# Patient Record
Sex: Female | Born: 1950
Health system: Southern US, Community
[De-identification: ages and names within clinical notes are randomized; demographics above are authoritative.]

## PROBLEM LIST (undated history)

## (undated) DIAGNOSIS — E079 Disorder of thyroid, unspecified: Secondary | ICD-10-CM

## (undated) DIAGNOSIS — I10 Essential (primary) hypertension: Secondary | ICD-10-CM

## (undated) DIAGNOSIS — T7840XA Allergy, unspecified, initial encounter: Secondary | ICD-10-CM

## (undated) HISTORY — DX: Essential (primary) hypertension: I10

## (undated) HISTORY — DX: Allergy, unspecified, initial encounter: T78.40XA

## (undated) HISTORY — PX: ABDOMINAL HYSTERECTOMY: SHX81

## (undated) HISTORY — DX: Disorder of thyroid, unspecified: E07.9

---

## 2000-05-30 ENCOUNTER — Ambulatory Visit (HOSPITAL_COMMUNITY): Admission: RE | Admit: 2000-05-30 | Discharge: 2000-05-30 | Payer: Self-pay | Admitting: Family Medicine

## 2001-07-07 ENCOUNTER — Other Ambulatory Visit: Admission: RE | Admit: 2001-07-07 | Discharge: 2001-07-07 | Payer: Self-pay | Admitting: Obstetrics and Gynecology

## 2001-08-18 ENCOUNTER — Encounter: Payer: Self-pay | Admitting: Family Medicine

## 2001-08-18 ENCOUNTER — Ambulatory Visit (HOSPITAL_COMMUNITY): Admission: RE | Admit: 2001-08-18 | Discharge: 2001-08-18 | Payer: Self-pay | Admitting: Family Medicine

## 2001-10-26 ENCOUNTER — Ambulatory Visit (HOSPITAL_COMMUNITY): Admission: RE | Admit: 2001-10-26 | Discharge: 2001-10-26 | Payer: Self-pay | Admitting: Family Medicine

## 2001-10-26 ENCOUNTER — Encounter: Payer: Self-pay | Admitting: Family Medicine

## 2001-11-25 ENCOUNTER — Emergency Department (HOSPITAL_COMMUNITY): Admission: EM | Admit: 2001-11-25 | Discharge: 2001-11-25 | Payer: Self-pay | Admitting: *Deleted

## 2001-11-25 ENCOUNTER — Encounter: Payer: Self-pay | Admitting: *Deleted

## 2002-02-17 ENCOUNTER — Observation Stay (HOSPITAL_COMMUNITY): Admission: EM | Admit: 2002-02-17 | Discharge: 2002-02-18 | Payer: Self-pay | Admitting: Emergency Medicine

## 2002-08-18 ENCOUNTER — Encounter: Payer: Self-pay | Admitting: Family Medicine

## 2002-08-18 ENCOUNTER — Ambulatory Visit (HOSPITAL_COMMUNITY): Admission: RE | Admit: 2002-08-18 | Discharge: 2002-08-18 | Payer: Self-pay | Admitting: Family Medicine

## 2002-12-20 ENCOUNTER — Emergency Department (HOSPITAL_COMMUNITY): Admission: EM | Admit: 2002-12-20 | Discharge: 2002-12-20 | Payer: Self-pay | Admitting: *Deleted

## 2003-02-02 ENCOUNTER — Encounter: Payer: Self-pay | Admitting: Family Medicine

## 2003-02-02 ENCOUNTER — Ambulatory Visit (HOSPITAL_COMMUNITY): Admission: RE | Admit: 2003-02-02 | Discharge: 2003-02-02 | Payer: Self-pay | Admitting: Family Medicine

## 2003-02-14 ENCOUNTER — Ambulatory Visit (HOSPITAL_COMMUNITY): Admission: RE | Admit: 2003-02-14 | Discharge: 2003-02-14 | Payer: Self-pay | Admitting: Family Medicine

## 2003-02-14 ENCOUNTER — Encounter: Payer: Self-pay | Admitting: Family Medicine

## 2003-08-22 ENCOUNTER — Encounter: Payer: Self-pay | Admitting: Family Medicine

## 2003-08-22 ENCOUNTER — Ambulatory Visit (HOSPITAL_COMMUNITY): Admission: RE | Admit: 2003-08-22 | Discharge: 2003-08-22 | Payer: Self-pay | Admitting: Family Medicine

## 2003-12-15 ENCOUNTER — Emergency Department (HOSPITAL_COMMUNITY): Admission: EM | Admit: 2003-12-15 | Discharge: 2003-12-15 | Payer: Self-pay | Admitting: Internal Medicine

## 2004-12-02 ENCOUNTER — Emergency Department (HOSPITAL_COMMUNITY): Admission: EM | Admit: 2004-12-02 | Discharge: 2004-12-02 | Payer: Self-pay | Admitting: Emergency Medicine

## 2004-12-05 ENCOUNTER — Ambulatory Visit (HOSPITAL_COMMUNITY): Admission: RE | Admit: 2004-12-05 | Discharge: 2004-12-05 | Payer: Self-pay | Admitting: Internal Medicine

## 2004-12-12 ENCOUNTER — Ambulatory Visit (HOSPITAL_COMMUNITY): Admission: RE | Admit: 2004-12-12 | Discharge: 2004-12-12 | Payer: Self-pay | Admitting: Family Medicine

## 2010-08-07 ENCOUNTER — Emergency Department (HOSPITAL_COMMUNITY): Admission: EM | Admit: 2010-08-07 | Discharge: 2010-08-07 | Payer: Self-pay | Admitting: Emergency Medicine

## 2011-05-02 NOTE — Cardiovascular Report (Signed)
[place]  Patient:    Amanda Kemp, Amanda Kemp Visit Number: [id number] MRN: [id number]          Service Type: MED Location: [id number] Attending Physician:  Virgina Evener Dictated by:   Runell Gess, M.D. Proc. Date: 02/17/02 Admit Date:  02/17/2002 Discharge Date: 02/18/2002   CC:         Cardiac Catheterization Laboratory             [facility], [address]             Blake Divine, M.D.             Jonell Cluck, M.D., [facility]                        Cardiac Catheterization  INDICATIONS: The patient is a 60 year old, African American female, with a recently diagnosed hyperlipidemia and new onset chest pain over the last weeks. She has had history of peptic ulcer disease and hiatal hernia. She recently had a Cardiolite which suggested infra-apical ischemia. Because of continuing chest pain, she was transferred from [facility] for diagnostic coronary arteriography.  DESCRIPTION OF PROCEDURE: The patient was brought to the second floor [place] [facility] in the postabsorptive state.  She was premedicated with p.o. Valium.  Her right groin was prepped and shaved in the usual sterile fashion.  Xylocaine 1% was used for local anesthesia.  A 6 French sheath was inserted into the right femoral artery using standard Seldinger technique.  A 6 French right and left Judkins diagnostic catheter as well as a 6 French pigtail catheter were used for selective coronary angiography, left ventriculography, and supravalvular aortography in the LAO Retrograde, aortic, left ventricular, and pullback pressures were recorded.  HEMODYNAMICS: 1. Aortic systolic pressure 137, diastolic pressure 85. 2. Left ventricular systolic pressure 135, diastolic pressure 10.  SELECTIVE CORONARY ANGIOGRAPHY: 1. Left main: Normal. 2. Left anterior descending: Normal. 3. Left  circumflex: 4. Right coronary artery: Dominant and normal.  LEFT VENTRICULOGRAPHY: The left ventriculogram was performed using 20 cc of Omnipaque dye at 10 cc/sec. The overall LVEF was estimated at greater than 60% without focal wall motion abnormalities.  SUPRAVALVULAR AORTOGRAPHY: Supravalvular aortography performed in the LAO cranial view using 20 cc of Omnipaque dye at 20 cc/sec. There is no AI noted. Aortic arch was normal caliber. There was no dissection. Arch vessels were intact.  IMPRESSION: The patient has normal coronary arteries and normal left ventricular function. I suspect her chest pain is noncardiac and her Cardiolite is false-positive, most likely ______ breast attenuation. She will be treated empirically with antireflux measures. An ACT was measured and the sheaths were removed. Pressure was held on the groin to achieve hemostasis. The patient left the lab in stable condition. She will be discharged home in the morning and followup with Dr. Kem Boroughs, who was notified of these results. Dictated by:   Runell Gess, M.D. Attending Physician:  Virgina Evener DD:  02/17/02 TD:  02/18/02 Job: [id number]

## 2013-08-04 ENCOUNTER — Ambulatory Visit: Payer: BC Managed Care – PPO | Admitting: Family Medicine

## 2013-08-04 DIAGNOSIS — Z0289 Encounter for other administrative examinations: Secondary | ICD-10-CM

## 2015-02-24 ENCOUNTER — Encounter (HOSPITAL_COMMUNITY): Payer: Self-pay | Admitting: Emergency Medicine

## 2015-02-24 ENCOUNTER — Emergency Department (HOSPITAL_COMMUNITY)
Admission: EM | Admit: 2015-02-24 | Discharge: 2015-02-24 | Disposition: A | Discharge status: Home / Self Care | Payer: BLUE CROSS/BLUE SHIELD | Attending: Emergency Medicine | Admitting: Emergency Medicine

## 2015-02-24 DIAGNOSIS — M549 Dorsalgia, unspecified: Secondary | ICD-10-CM | POA: Diagnosis present

## 2015-02-24 MED ORDER — METHOCARBAMOL 500 MG PO TABS: 500.0000 mg | ORAL_TABLET | Freq: Two times a day (BID) | ORAL | Status: DC | PRN

## 2015-02-24 MED ORDER — KETOROLAC TROMETHAMINE 60 MG/2ML IM SOLN
30.0000 mg | Freq: Once | INTRAMUSCULAR | Status: AC
  Administered 2015-02-24: 30 mg via INTRAMUSCULAR
  Filled 2015-02-24: qty 2

## 2015-02-24 MED ORDER — NAPROXEN 250 MG PO TABS: 250.0000 mg | ORAL_TABLET | Freq: Two times a day (BID) | ORAL | Status: DC

## 2015-02-24 NOTE — ED Provider Notes (Signed)
CSN: [id number]     Arrival date & time 02/24/15  1607 History   First MD Initiated Contact with Patient 02/24/15 1648     Chief Complaint  Patient presents with  . Back Pain   Amanda Kemp is a 64 y.o. female who presents the emergency department complaining of mid left-sided back pain since yesterday that is worsened today. She rates her pain at 8/10 currently that is worse with movement. She characterizes the pain as sharp. She reports the pain is constant and worse with movement or walking. She denies injury or trauma to her back she denies any falls or motor vehicle collisions. Patient has attempted nothing for treatment today. She denies dysuria, hematuria, urinary urgency, urinary frequency or difficulty urinating. The patient denies any numbness, tingling or weakness. She denies any difficulty urinating. She denies loss of bowel or bladder control or saddle anesthesia. She denies history of IV drug use or cancer.   (Consider location/radiation/quality/duration/timing/severity/associated sxs/prior Treatment) HPI  History reviewed. No pertinent past medical history. History reviewed. No pertinent past surgical history. History reviewed. No pertinent family history. History  Substance Use Topics  . Smoking status: Never Smoker   . Smokeless tobacco: Not on file  . Alcohol Use: No   OB History    No data available     Review of Systems  Constitutional: Negative for fever and chills.  Respiratory: Negative for shortness of breath.   Gastrointestinal: Negative for nausea, vomiting, abdominal pain and diarrhea.  Genitourinary: Negative for dysuria, urgency, frequency, hematuria, flank pain and difficulty urinating.  Musculoskeletal: Positive for back pain. Negative for neck pain and neck stiffness.  Skin: Negative for rash.  Neurological: Negative for weakness, light-headedness, numbness and headaches.      Allergies  Review of patient's allergies indicates no known  allergies.  Home Medications   Prior to Admission medications   Medication Sig Start Date End Date Taking? Authorizing Provider  methocarbamol (ROBAXIN) 500 MG tablet Take 1 tablet (500 mg total) by mouth 2 (two) times daily as needed for muscle spasms. 02/24/15   Everlene Farrier, PA-C  naproxen (NAPROSYN) 250 MG tablet Take 1 tablet (250 mg total) by mouth 2 (two) times daily with a meal. 02/24/15   Everlene Farrier, PA-C   BP 131/81 mmHg  Pulse 74  Temp(Src) 98 F (36.7 C) (Oral)  Resp 21  SpO2 99% Physical Exam  Constitutional: She is oriented to person, place, and time. She appears well-developed and well-nourished. No distress.  HENT:  Head: Normocephalic and atraumatic.  Mouth/Throat: Oropharynx is clear and moist.  Eyes: Conjunctivae are normal. Pupils are equal, round, and reactive to light. Right eye exhibits no discharge. Left eye exhibits no discharge.  Neck: Neck supple.  Cardiovascular: Normal rate, regular rhythm, normal heart sounds and intact distal pulses.  Exam reveals no gallop and no friction rub.   No murmur heard. Pulmonary/Chest: Effort normal and breath sounds normal. No respiratory distress. She has no wheezes. She has no rales.  Abdominal: Soft. She exhibits no distension. There is no tenderness.  Musculoskeletal: Normal range of motion. She exhibits tenderness. She exhibits no edema.  Left mid back is mildly tender to palpation. Muscles feel to be in spasm. No midline tenderness. No edema or deformity. Patient is spontaneously moving all extremities in a coordinated fashion exhibiting good strength. The patient is able to ambulate without difficulty or assistance.   Lymphadenopathy:    She has no cervical adenopathy.  Neurological: She is alert and  oriented to person, place, and time. She has normal reflexes. She displays normal reflexes. Coordination normal.  Sensation is intact in her bilateral upper and lower extremities. bilateral patellar DTRs are intact.   Skin: Skin is warm and dry. No rash noted. She is not diaphoretic. No erythema. No pallor.  Psychiatric: She has a normal mood and affect. Her behavior is normal.  Nursing note and vitals reviewed.   ED Course  Procedures (including critical care time) Labs Review Labs Reviewed - No data to display  Imaging Review No results found.   EKG Interpretation None      Filed Vitals:   02/24/15 1618  BP: 131/81  Pulse: 74  Temp: 98 F (36.7 C)  TempSrc: Oral  Resp: 21  SpO2: 99%     MDM   Meds given in ED:  Medications  ketorolac (TORADOL) injection 30 mg (30 mg Intramuscular Given 02/24/15 1721)    Discharge Medication List as of 02/24/2015  5:05 PM    START taking these medications   Details  methocarbamol (ROBAXIN) 500 MG tablet Take 1 tablet (500 mg total) by mouth 2 (two) times daily as needed for muscle spasms., Starting 02/24/2015, Until Discontinued, Print    naproxen (NAPROSYN) 250 MG tablet Take 1 tablet (250 mg total) by mouth 2 (two) times daily with a meal., Starting 02/24/2015, Until Discontinued, Print        Final diagnoses:  Mid back pain on left side   Patient with back pain.  No neurological deficits and normal neuro exam.  Patient can walk but states is painful.  No loss of bowel or bladder control.  No concern for cauda equina.  No fever, night sweats, weight loss, h/o cancer, IVDU. No urinary symptoms.  RICE protocol and pain medicine indicated and discussed with patient. I advised the patient to follow-up with their primary care provider this week. I advised the patient to return to the emergency department with new or worsening symptoms or new concerns. The patient verbalized understanding and agreement with plan.       Everlene FarrierWilliam Orah Sonnen, PA-C 02/24/15 1732  Gwyneth SproutWhitney Plunkett, MD 02/24/15 [phone]

## 2015-02-24 NOTE — ED Notes (Signed)
Pt c/o low back pain since yesterday.  Worse with movement.  Denies NVD, dysuria, abd pain.

## 2015-02-24 NOTE — Discharge Instructions (Signed)
Back Pain, Adult °Low back pain is very common. About 1 in 5 people have back pain. The cause of low back pain is rarely dangerous. The pain often gets better over time. About half of people with a sudden onset of back pain feel better in just 2 weeks. About 8 in 10 people feel better by 6 weeks.  °CAUSES °Some common causes of back pain include: °· Strain of the muscles or ligaments supporting the spine. °· Wear and tear (degeneration) of the spinal discs. °· Arthritis. °· Direct injury to the back. °DIAGNOSIS °Most of the time, the direct cause of low back pain is not known. However, back pain can be treated effectively even when the exact cause of the pain is unknown. Answering your caregiver's questions about your overall health and symptoms is one of the most accurate ways to make sure the cause of your pain is not dangerous. If your caregiver needs more information, he or she may order lab work or imaging tests (X-rays or MRIs). However, even if imaging tests show changes in your back, this usually does not require surgery. °HOME CARE INSTRUCTIONS °For many people, back pain returns. Since low back pain is rarely dangerous, it is often a condition that people can learn to manage on their own.  °· Remain active. It is stressful on the back to sit or stand in one place. Do not sit, drive, or stand in one place for more than 30 minutes at a time. Take short walks on level surfaces as soon as pain allows. Try to increase the length of time you walk each day. °· Do not stay in bed. Resting more than 1 or 2 days can delay your recovery. °· Do not avoid exercise or work. Your body is made to move. It is not dangerous to be active, even though your back may hurt. Your back will likely heal faster if you return to being active before your pain is gone. °· Pay attention to your body when you  bend and lift. Many people have less discomfort when lifting if they bend their knees, keep the load close to their bodies, and  avoid twisting. Often, the most comfortable positions are those that put less stress on your recovering back. °· Find a comfortable position to sleep. Use a firm mattress and lie on your side with your knees slightly bent. If you lie on your back, put a pillow under your knees. °· Only take over-the-counter or prescription medicines as directed by your caregiver. Over-the-counter medicines to reduce pain and inflammation are often the most helpful. Your caregiver may prescribe muscle relaxant drugs. These medicines help dull your pain so you can more quickly return to your normal activities and healthy exercise. °· Put ice on the injured area. °· Put ice in a plastic bag. °· Place a towel between your skin and the bag. °· Leave the ice on for 15-20 minutes, 03-04 times a day for the first 2 to 3 days. After that, ice and heat may be alternated to reduce pain and spasms. °· Ask your caregiver about trying back exercises and gentle massage. This may be of some benefit. °· Avoid feeling anxious or stressed. Stress increases muscle tension and can worsen back pain. It is important to recognize when you are anxious or stressed and learn ways to manage it. Exercise is a great option. °SEEK MEDICAL CARE IF: °· You have pain that is not relieved with rest or medicine. °· You have pain that does not improve in 1 week. °· You have new symptoms. °· You are generally not feeling well. °SEEK   IMMEDIATE MEDICAL CARE IF:   You have pain that radiates from your back into your legs.  You develop new bowel or bladder control problems.  You have unusual weakness or numbness in your arms or legs.  You develop nausea or vomiting.  You develop abdominal pain.  You feel faint. Document Released: 12/01/2005 Document Revised: 06/01/2012 Document Reviewed: 04/04/2014 [facility] Patient Information 2015 [place], [place]. This information is not intended to replace advice given to you by your health care provider. Make sure you  discuss any questions you have with your health care provider.  Back Exercises Back exercises help treat and prevent back injuries. The goal of back exercises is to increase the strength of your abdominal and back muscles and the flexibility of your back. These exercises should be started when you no longer have back pain. Back exercises include:  Pelvic Tilt. Lie on your back with your knees bent. Tilt your pelvis until the lower part of your back is against the floor. Hold this position 5 to 10 sec and repeat 5 to 10 times.  Knee to Chest. Pull first 1 knee up against your chest and hold for 20 to 30 seconds, repeat this with the other knee, and then both knees. This may be done with the other leg straight or bent, whichever feels better.  Sit-Ups or Curl-Ups. Bend your knees 90 degrees. Start with tilting your pelvis, and do a partial, slow sit-up, lifting your trunk only 30 to 45 degrees off the floor. Take at least 2 to 3 seconds for each sit-up. Do not do sit-ups with your knees out straight. If partial sit-ups are difficult, simply do the above but with only tightening your abdominal muscles and holding it as directed.  Hip-Lift. Lie on your back with your knees flexed 90 degrees. Push down with your feet and shoulders as you raise your hips a couple inches off the floor; hold for 10 seconds, repeat 5 to 10 times.  Back arches. Lie on your stomach, propping yourself up on bent elbows. Slowly press on your hands, causing an arch in your low back. Repeat 3 to 5 times. Any initial stiffness and discomfort should lessen with repetition over time.  Shoulder-Lifts. Lie face down with arms beside your body. Keep hips and torso pressed to floor as you slowly lift your head and shoulders off the floor. Do not overdo your exercises, especially in the beginning. Exercises may cause you some mild back discomfort which lasts for a few minutes; however, if the pain is more severe, or lasts for more than 15  minutes, do not continue exercises until you see your caregiver. Improvement with exercise therapy for back problems is slow.  See your caregivers for assistance with developing a proper back exercise program. Document Released: 01/08/2005 Document Revised: 02/23/2012 Document Reviewed: 10/02/2011 [facility] Patient Information 2015 [place], [place]. This information is not intended to replace advice given to you by your health care provider. Make sure you discuss any questions you have with your health care provider.  Back Injury Prevention Back injuries can be extremely painful and difficult to heal. After having one back injury, you are much more likely to experience another later on. It is important to learn how to avoid injuring or re-injuring your back. The following tips can help you to prevent a back injury. PHYSICAL FITNESS  Exercise regularly and try to develop good tone in your abdominal muscles. Your abdominal muscles provide a lot of the support needed by your back.  Do aerobic exercises (walking, jogging, biking, swimming) regularly.  Do exercises that increase balance and strength (tai chi, yoga) regularly. This can decrease your risk of falling and injuring your back.  Stretch before and after exercising.  Maintain a healthy weight. The more you weigh, the more stress is placed on your back. For every pound of weight, 10 times that amount of pressure is placed on the back. DIET  Talk to your caregiver about how much calcium and vitamin D you need per day. These nutrients help to prevent weakening of the bones (osteoporosis). Osteoporosis can cause broken (fractured) bones that lead to back pain.  Include good sources of calcium in your diet, such as dairy products, green, leafy vegetables, and products with calcium added (fortified).  Include good sources of vitamin D in your diet, such as milk and foods that are fortified with vitamin D.  Consider taking a nutritional  supplement or a multivitamin if needed.  Stop smoking if you smoke. POSTURE  Sit and stand up straight. Avoid leaning forward when you sit or hunching over when you stand.  Choose chairs with good low back (lumbar) support.  If you work at a desk, sit close to your work so you do not need to lean over. Keep your chin tucked in. Keep your neck drawn back and elbows bent at a right angle. Your arms should look like the letter "L."  Sit high and close to the steering wheel when you drive. Add a lumbar support to your car seat if needed.  Avoid sitting or standing in one position for too long. Take breaks to get up, stretch, and walk around at least once every hour. Take breaks if you are driving for long periods of time.  Sleep on your side with your knees slightly bent, or sleep on your back with a pillow under your knees. Do not sleep on your stomach. LIFTING, TWISTING, AND REACHING  Avoid heavy lifting, especially repetitive lifting. If you must do heavy lifting:  Stretch before lifting.  Work slowly.  Rest between lifts.  Use carts and dollies to move objects when possible.  Make several small trips instead of carrying 1 heavy load.  Ask for help when you need it.  Ask for help when moving big, awkward objects.  Follow these steps when lifting:  Stand with your feet shoulder-width apart.  Get as close to the object as you can. Do not try to pick up heavy objects that are far from your body.  Use handles or lifting straps if they are available.  Bend at your knees. Squat down, but keep your heels off the floor.  Keep your shoulders pulled back, your chin tucked in, and your back straight.  Lift the object slowly, tightening the muscles in your legs, abdomen, and buttocks. Keep the object as close to the center of your body as possible.  When you put a load down, use these same guidelines in reverse.  Do not:  Lift the object above your waist.  Twist at the waist  while lifting or carrying a load. Move your feet if you need to turn, not your waist.  Bend over without bending at your knees.  Avoid reaching over your head, across a table, or for an object on a high surface. OTHER TIPS  Avoid wet floors and keep sidewalks clear of ice to prevent falls.  Do not sleep on a mattress that is too soft or too hard.  Keep items that are  used frequently within easy reach.  Put heavier objects on shelves at waist level and lighter objects on lower or higher shelves.  Find ways to decrease your stress, such as exercise, massage, or relaxation techniques. Stress can build up in your muscles. Tense muscles are more vulnerable to injury.  Seek treatment for depression or anxiety if needed. These conditions can increase your risk of developing back pain. SEEK MEDICAL CARE IF:  You injure your back.  You have questions about diet, exercise, or other ways to prevent back injuries. MAKE SURE YOU:  Understand these instructions.  Will watch your condition.  Will get help right away if you are not doing well or get worse. Document Released: 01/08/2005 Document Revised: 02/23/2012 Document Reviewed: 01/12/2012 [facility] Patient Information 2015 [place], [place]. This information is not intended to replace advice given to you by your health care provider. Make sure you discuss any questions you have with your health care provider.

## 2015-05-08 ENCOUNTER — Ambulatory Visit (INDEPENDENT_AMBULATORY_CARE_PROVIDER_SITE_OTHER): Payer: BLUE CROSS/BLUE SHIELD | Admitting: Physician Assistant

## 2015-05-08 VITALS — BP 130/80 | HR 74 | Temp 98.9°F | Resp 16 | Ht 66.5 in | Wt 176.0 lb

## 2015-05-08 DIAGNOSIS — Z8679 Personal history of other diseases of the circulatory system: Secondary | ICD-10-CM | POA: Diagnosis not present

## 2015-05-08 DIAGNOSIS — Z91048 Other nonmedicinal substance allergy status: Secondary | ICD-10-CM | POA: Diagnosis not present

## 2015-05-08 DIAGNOSIS — J3089 Other allergic rhinitis: Secondary | ICD-10-CM | POA: Insufficient documentation

## 2015-05-08 DIAGNOSIS — I10 Essential (primary) hypertension: Secondary | ICD-10-CM | POA: Insufficient documentation

## 2015-05-08 DIAGNOSIS — Z9109 Other allergy status, other than to drugs and biological substances: Secondary | ICD-10-CM

## 2015-05-08 MED ORDER — LISINOPRIL 10 MG PO TABS: 10.0000 mg | ORAL_TABLET | Freq: Every day | ORAL | Status: DC

## 2015-05-08 MED ORDER — CETIRIZINE HCL 10 MG PO TABS
10.0000 mg | ORAL_TABLET | Freq: Once | ORAL | Status: AC
  Administered 2015-05-08: 10 mg via ORAL

## 2015-05-08 MED ORDER — CETIRIZINE HCL 10 MG PO TABS: 10.0000 mg | ORAL_TABLET | Freq: Every day | ORAL | Status: DC

## 2015-05-08 NOTE — Patient Instructions (Signed)
CPE will be scheduled today for 2 weeks, Please come in fasting nothing to eat or drink after 12am the night before. Refills for medications sent to pharmacy  Hypertension Hypertension, commonly called high blood pressure, is when the force of blood pumping through your arteries is too strong. Your arteries are the blood vessels that carry blood from your heart throughout your body. A blood pressure reading consists of a higher number over a lower number, such as 110/72. The higher number (systolic) is the pressure inside your arteries when your heart pumps. The lower number (diastolic) is the pressure inside your arteries when your heart relaxes. Ideally you want your blood pressure below 120/80. Hypertension forces your heart to work harder to pump blood. Your arteries may become narrow or stiff. Having hypertension puts you at risk for heart disease, stroke, and other problems.  RISK FACTORS Some risk factors for high blood pressure are controllable. Others are not.  Risk factors you cannot control include:   Race. You may be at higher risk if you are African American.  Age. Risk increases with age.  Gender. Men are at higher risk than women before age 63 years. After age 63, women are at higher risk than men. Risk factors you can control include:  Not getting enough exercise or physical activity.  Being overweight.  Getting too much fat, sugar, calories, or salt in your diet.  Drinking too much alcohol. SIGNS AND SYMPTOMS Hypertension does not usually cause signs or symptoms. Extremely high blood pressure (hypertensive crisis) may cause headache, anxiety, shortness of breath, and nosebleed. DIAGNOSIS  To check if you have hypertension, your health care provider will measure your blood pressure while you are seated, with your arm held at the level of your heart. It should be measured at least twice using the same arm. Certain conditions can cause a difference in blood pressure between  your right and left arms. A blood pressure reading that is higher than normal on one occasion does not mean that you need treatment. If one blood pressure reading is high, ask your health care provider about having it checked again. TREATMENT  Treating high blood pressure includes making lifestyle changes and possibly taking medicine. Living a healthy lifestyle can help lower high blood pressure. You may need to change some of your habits. Lifestyle changes may include:  Following the DASH diet. This diet is high in fruits, vegetables, and whole grains. It is low in salt, red meat, and added sugars.  Getting at least 2 hours of brisk physical activity every week.  Losing weight if necessary.  Not smoking.  Limiting alcoholic beverages.  Learning ways to reduce stress. If lifestyle changes are not enough to get your blood pressure under control, your health care provider may prescribe medicine. You may need to take more than one. Work closely with your health care provider to understand the risks and benefits. HOME CARE INSTRUCTIONS  Have your blood pressure rechecked as directed by your health care provider.   Take medicines only as directed by your health care provider. Follow the directions carefully. Blood pressure medicines must be taken as prescribed. The medicine does not work as well when you skip doses. Skipping doses also puts you at risk for problems.   Do not smoke.   Monitor your blood pressure at home as directed by your health care provider. SEEK MEDICAL CARE IF:   You think you are having a reaction to medicines taken.  You have recurrent headaches or  feel dizzy.  You have swelling in your ankles.  You have trouble with your vision. SEEK IMMEDIATE MEDICAL CARE IF:  You develop a severe headache or confusion.  You have unusual weakness, numbness, or feel faint.  You have severe chest or abdominal pain.  You vomit repeatedly.  You have trouble  breathing. MAKE SURE YOU:   Understand these instructions.  Will watch your condition.  Will get help right away if you are not doing well or get worse. Document Released: 12/01/2005 Document Revised: 04/17/2014 Document Reviewed: 09/23/2013 [facility] Patient Information 2015 [place], [place]. This information is not intended to replace advice given to you by your health care provider. Make sure you discuss any questions you have with your health care provider.

## 2015-05-08 NOTE — Progress Notes (Signed)
05/08/2015 at 2:29 PM  Amanda Kemp / DOB: 10-14-1951 / MRN: [id number]  The patient  does not have a problem list on file.  SUBJECTIVE  Chief complaint: head congestion; Headache; and URI  Amanda Kemp is a 64 y.o. female complaining of headache and sinus and nasal congestion that started 4 days ago.  Associated symptoms include runny nose eye itching, ear itching today, and she denies fever, cough, sore throat, difficulty breathing and jaw pain.The patient symptoms show no change. Treatments tried thus far include nothing. She denies sick contacts. She reports a history of allergies.   She reports retroorbital and frontal HA.  She feels that her vision is a little more blurry than usual but she has not been wearing her prescription glasses either. She has tried Aleve and this did not help.  She denies nausea, emesis, loss of consciousness, changes gait and dexterity.    She has a history of HTN and has not been taking her BP medication because she is out of it.  [facility] and would like a refill of her medication. She does not know what she takes.    She  has a past medical history of Thyroid disease.    Medications reviewed and updated by myself where necessary, and exist elsewhere in the encounter.   Amanda Kemp has No Known Allergies. She  reports that she has never smoked. She does not have any smokeless tobacco history on file. She reports that she does not drink alcohol or use illicit drugs. She  has no sexual activity history on file. The patient  has no past surgical history on file.  Her family history includes Hyperlipidemia in her mother.  Review of Systems  Constitutional: Negative for fever and chills.  HENT: Positive for congestion. Negative for sore throat and tinnitus.   Eyes: Positive for discharge (clear) and redness.  Respiratory: Negative for cough and wheezing.   Cardiovascular: Negative for chest pain and  palpitations.  Gastrointestinal: Negative for nausea and vomiting.  Genitourinary: Negative.   Musculoskeletal: Negative for myalgias.  Skin: Negative for itching and rash.  Neurological: Positive for headaches. Negative for dizziness.  Psychiatric/Behavioral: Negative for depression.    OBJECTIVE  Her  height is 5' 6.5" (1.689 m) and weight is 176 lb (79.833 kg). Her oral temperature is 98.9 F (37.2 C). Her blood pressure is 130/80 and her pulse is 74. Her respiration is 16 and oxygen saturation is 98%.  The patient's body mass index is 27.98 kg/(m^2).  Physical Exam  Constitutional: She is oriented to person, place, and time. She appears well-developed and well-nourished. No distress.  HENT:  Head: Normocephalic.  Eyes: Conjunctivae and EOM are normal. Pupils are equal, round, and reactive to light.  Neck: Normal range of motion. Neck supple.  Musculoskeletal: Normal range of motion.  Neurological: She is alert and oriented to person, place, and time. She displays normal reflexes. No cranial nerve deficit or sensory deficit. She exhibits normal muscle tone. Coordination and gait normal. GCS eye subscore is 4. GCS verbal subscore is 5. GCS motor subscore is 6.  Reflex Scores:      Tricep reflexes are 2+ on the right side and 2+ on the left side.      Bicep reflexes are 2+ on the right side and 2+ on the left side.      Brachioradialis reflexes are 2+ on the right side and 2+ on the left  side.      Patellar reflexes are 2+ on the right side and 2+ on the left side.      Achilles reflexes are 2+ on the right side and 2+ on the left side. Skin: Skin is warm and dry. She is not diaphoretic.  Psychiatric: She has a normal mood and affect.    No results found for this or any previous visit (from the past 24 hour(s)).  ASSESSMENT & PLAN  Amanda Kemp was seen today for head congestion, headache and uri.  Diagnoses and all orders for this visit:  Environmental allergies: Most likely  cause of her HA.  Will treat with daily zyrtec and will titrate allergy therapies as needed at future encounters.   Orders: -     cetirizine (ZYRTEC) 10 MG tablet; Take 1 tablet (10 mg total) by mouth daily.   History of hypertension: Patient with non focal neurological exam today.  I do not think her HA is a result of her history of HTN, as her BP is normal today. Will provide her with a limited supply of HTN medication and will see her back for physical at 104 in 2 weeks.   Orders: -     lisinopril (PRINIVIL,ZESTRIL) 10 MG tablet; Take 1 tablet (10 mg total) by mouth daily.    The patient was advised to call or come back to clinic if she does not see an improvement in symptoms, or worsens with the above plan.   Amanda Kemp, MHS, PA-C [facility] 05/08/2015 2:29 PM

## 2015-05-21 ENCOUNTER — Encounter: Payer: Self-pay | Admitting: Physician Assistant

## 2015-05-21 ENCOUNTER — Ambulatory Visit (INDEPENDENT_AMBULATORY_CARE_PROVIDER_SITE_OTHER): Payer: BLUE CROSS/BLUE SHIELD | Admitting: Physician Assistant

## 2015-05-21 VITALS — BP 110/66 | HR 75 | Temp 98.1°F | Resp 16 | Ht 65.0 in | Wt 172.8 lb

## 2015-05-21 DIAGNOSIS — Z131 Encounter for screening for diabetes mellitus: Secondary | ICD-10-CM | POA: Diagnosis not present

## 2015-05-21 DIAGNOSIS — Z1211 Encounter for screening for malignant neoplasm of colon: Secondary | ICD-10-CM | POA: Diagnosis not present

## 2015-05-21 DIAGNOSIS — Z1389 Encounter for screening for other disorder: Secondary | ICD-10-CM | POA: Diagnosis not present

## 2015-05-21 DIAGNOSIS — Z124 Encounter for screening for malignant neoplasm of cervix: Secondary | ICD-10-CM

## 2015-05-21 DIAGNOSIS — Z130 Encounter for screening for diseases of the blood and blood-forming organs and certain disorders involving the immune mechanism: Secondary | ICD-10-CM

## 2015-05-21 DIAGNOSIS — Z1329 Encounter for screening for other suspected endocrine disorder: Secondary | ICD-10-CM

## 2015-05-21 DIAGNOSIS — Z113 Encounter for screening for infections with a predominantly sexual mode of transmission: Secondary | ICD-10-CM | POA: Diagnosis not present

## 2015-05-21 DIAGNOSIS — Z1239 Encounter for other screening for malignant neoplasm of breast: Secondary | ICD-10-CM

## 2015-05-21 DIAGNOSIS — Z0000 Encounter for general adult medical examination without abnormal findings: Secondary | ICD-10-CM

## 2015-05-21 DIAGNOSIS — R21 Rash and other nonspecific skin eruption: Secondary | ICD-10-CM

## 2015-05-21 DIAGNOSIS — Z23 Encounter for immunization: Secondary | ICD-10-CM | POA: Diagnosis not present

## 2015-05-21 DIAGNOSIS — Z760 Encounter for issue of repeat prescription: Secondary | ICD-10-CM | POA: Diagnosis not present

## 2015-05-21 DIAGNOSIS — Z13220 Encounter for screening for lipoid disorders: Secondary | ICD-10-CM

## 2015-05-21 LAB — POCT CBC
Granulocyte percent: 60 %G (ref 37–80)
HCT, POC: 38.2 % (ref 37.7–47.9)
HEMOGLOBIN: 12.6 g/dL (ref 12.2–16.2)
Lymph, poc: 2.9 (ref 0.6–3.4)
MCH, POC: 26 pg — AB (ref 27–31.2)
MCHC: 33.1 g/dL (ref 31.8–35.4)
MCV: 78.6 fL — AB (ref 80–97)
MID (CBC): 0.2 (ref 0–0.9)
MPV: 8.5 fL (ref 0–99.8)
POC Granulocyte: 4.6 (ref 2–6.9)
POC LYMPH %: 38 % (ref 10–50)
POC MID %: 2 %M (ref 0–12)
Platelet Count, POC: 374 10*3/uL (ref 142–424)
RBC: 4.85 M/uL (ref 4.04–5.48)
RDW, POC: 15.2 %
WBC: 7.7 10*3/uL (ref 4.6–10.2)

## 2015-05-21 LAB — LIPID PANEL
Cholesterol: 350 mg/dL — ABNORMAL HIGH (ref 0–200)
HDL: 47 mg/dL (ref >=46)
LDL CALC: 279 mg/dL — AB (ref 0–99)
Total CHOL/HDL Ratio: 7.4 Ratio
Triglycerides: 122 mg/dL (ref <150)
VLDL: 24 mg/dL (ref 0–40)

## 2015-05-21 LAB — COMPREHENSIVE METABOLIC PANEL
ALT: 12 U/L (ref 0–35)
AST: 15 U/L (ref 0–37)
Albumin: 4.2 g/dL (ref 3.5–5.2)
Alkaline Phosphatase: 52 U/L (ref 39–117)
BILIRUBIN TOTAL: 0.4 mg/dL (ref 0.2–1.2)
BUN: 14 mg/dL (ref 6–23)
CHLORIDE: 105 meq/L (ref 96–112)
CO2: 25 mEq/L (ref 19–32)
Calcium: 9.6 mg/dL (ref 8.4–10.5)
Creat: 0.8 mg/dL (ref 0.50–1.10)
Glucose, Bld: 91 mg/dL (ref 70–99)
POTASSIUM: 4.2 meq/L (ref 3.5–5.3)
Sodium: 140 mEq/L (ref 135–145)
Total Protein: 7.2 g/dL (ref 6.0–8.3)

## 2015-05-21 LAB — POCT WET PREP WITH KOH
Clue Cells Wet Prep HPF POC: NEGATIVE
KOH Prep POC: NEGATIVE
RBC Wet Prep HPF POC: NEGATIVE
TRICHOMONAS UA: NEGATIVE
Yeast Wet Prep HPF POC: NEGATIVE

## 2015-05-21 LAB — POCT URINALYSIS DIPSTICK
GLUCOSE UA: NEGATIVE
Ketones, UA: 15
Nitrite, UA: NEGATIVE
PH UA: 5
Protein, UA: 30
Spec Grav, UA: >=1.03
Urobilinogen, UA: 0.2

## 2015-05-21 LAB — HEMOGLOBIN A1C
Hgb A1c MFr Bld: 6.3 % — ABNORMAL HIGH (ref <5.7)
MEAN PLASMA GLUCOSE: 134 mg/dL — AB (ref <117)

## 2015-05-21 MED ORDER — LEVOTHYROXINE SODIUM 50 MCG PO TABS: 50.0000 ug | ORAL_TABLET | Freq: Every day | ORAL | Status: DC

## 2015-05-21 NOTE — Progress Notes (Signed)
05/23/2015 at 1:41 PM  Amanda Kemp / DOB: 1951-09-28 / MRN: [id number]  The patient has Essential hypertension and Environmental and seasonal allergies on her problem list.  SUBJECTIVE  Chief complaint: Annual Exam and Medication Refill  Patient here for an annual physical.  Reports she has not had any annual screening for over five years now.  Her last PAP was roughly 7 years ago, and her last colonoscopy was roughly 12 years ago.  She has never smoked. She does not drink and denies illicit drug use.  She currently engages in sexual activity.  She has a rash on her left upper back along the upper trapezius.  She had this evaluated at one time by her PCP, but she did not receive a diagnosis.      She  has a past medical history of Thyroid disease; Allergy; and Hypertension.    Medications reviewed and updated by myself where necessary, and exist elsewhere in the encounter.   Amanda Kemp has No Known Allergies. She  reports that she has never smoked. She does not have any smokeless tobacco history on file. She reports that she does not drink alcohol or use illicit drugs. She  reports that she currently engages in sexual activity. The patient  has no past surgical history on file.  Her family history includes Cancer in her brother, mother, sister, and sister; Hyperlipidemia in her mother; Hypertension in her mother.  ROS  OBJECTIVE  Her  height is 5\' 5"  (1.651 m) and weight is 172 lb 12.8 oz (78.382 kg). Her oral temperature is 98.1 F (36.7 C). Her blood pressure is 110/66 and her pulse is 75. Her respiration is 16 and oxygen saturation is 98%.  The patient's body mass index is 28.76 kg/(m^2).  Physical Exam  Recent Results (from the past 2160 hour(s))  Comprehensive metabolic panel     Status: None   Collection Time: 05/21/15  4:59 PM  Result Value Ref Range   Sodium 140 135 - 145 mEq/L   Potassium 4.2 3.5 - 5.3 mEq/L   Chloride 105 96 - 112 mEq/L   CO2 25 19 - 32 mEq/L   Glucose, Bld 91 70 - 99 mg/dL   BUN 14 6 - 23 mg/dL   Creat 4.09 8.11 - 9.14 mg/dL   Total Bilirubin 0.4 0.2 - 1.2 mg/dL   Alkaline Phosphatase 52 39 - 117 U/L   AST 15 0 - 37 U/L   ALT 12 0 - 35 U/L   Total Protein 7.2 6.0 - 8.3 g/dL   Albumin 4.2 3.5 - 5.2 g/dL   Calcium 9.6 8.4 - 78.2 mg/dL  Hemoglobin N5A     Status: Abnormal   Collection Time: 05/21/15  4:59 PM  Result Value Ref Range   Hgb A1c MFr Bld 6.3 (H) <5.7 %    Comment:                                                                        According to the ADA Clinical Practice Recommendations for 2011, when HbA1c is used as a screening test:     >=6.5%   Diagnostic of Diabetes Mellitus            (  if abnormal result is confirmed)   5.7-6.4%   Increased risk of developing Diabetes Mellitus   References:Diagnosis and Classification of Diabetes Mellitus,Diabetes Care,2011,34(Suppl 1):S62-S69 and Standards of Medical Care in         Diabetes - 2011,Diabetes Care,2011,34 (Suppl 1):S11-S61.      Mean Plasma Glucose 134 (H) <117 mg/dL  TSH     Status: Abnormal   Collection Time: 05/21/15  4:59 PM  Result Value Ref Range   TSH 6.643 (H) 0.350 - 4.500 uIU/mL  Lipid panel     Status: Abnormal   Collection Time: 05/21/15  4:59 PM  Result Value Ref Range   Cholesterol 350 (H) 0 - 200 mg/dL    Comment: ATP III Classification:       < 200        mg/dL        Desirable      161 - 239     mg/dL        Borderline High      >= 240        mg/dL        High      Triglycerides 122 <150 mg/dL   HDL 47 >=09 mg/dL    Comment: ** Please note change in reference range(s). **   Total CHOL/HDL Ratio 7.4 Ratio   VLDL 24 0 - 40 mg/dL   LDL Cholesterol 604 (H) 0 - 99 mg/dL    Comment:   Total Cholesterol/HDL Ratio:CHD Risk                        Coronary Heart Disease Risk Table                                        Men       Women          1/2 Average Risk              3.4        3.3              Average Risk              5.0         4.4           2X Average Risk              9.6        7.1           3X Average Risk             23.4       11.0 Use the calculated Patient Ratio above and the CHD Risk table  to determine the patient's CHD Risk. ATP III Classification (LDL):       < 100        mg/dL         Optimal      540 - 129     mg/dL         Near or Above Optimal      130 - 159     mg/dL         Borderline High      160 - 189     mg/dL         High       > 981  mg/dL         Very High     POCT CBC     Status: Abnormal   Collection Time: 05/21/15  5:20 PM  Result Value Ref Range   WBC 7.7 4.6 - 10.2 K/uL   Lymph, poc 2.9 0.6 - 3.4   POC LYMPH PERCENT 38.0 10 - 50 %L   MID (cbc) 0.2 0 - 0.9   POC MID % 2.0 0 - 12 %M   POC Granulocyte 4.6 2 - 6.9   Granulocyte percent 60.0 37 - 80 %G   RBC 4.85 4.04 - 5.48 M/uL   Hemoglobin 12.6 12.2 - 16.2 g/dL   HCT, POC 16.138.2 09.637.7 - 47.9 %   MCV 78.6 (A) 80 - 97 fL   MCH, POC 26.0 (A) 27 - 31.2 pg   MCHC 33.1 31.8 - 35.4 g/dL   RDW, POC 04.515.2 %   Platelet Count, POC 374 142 - 424 K/uL   MPV 8.5 0 - 99.8 fL  POCT urinalysis dipstick     Status: None   Collection Time: 05/21/15  5:21 PM  Result Value Ref Range   Color, UA yellow    Clarity, UA cloudy    Glucose, UA neg    Bilirubin, UA small    Ketones, UA 15    Spec Grav, UA >=1.030    Blood, UA small    pH, UA 5.0    Protein, UA 30    Urobilinogen, UA 0.2    Nitrite, UA neg    Leukocytes, UA small (1+)   Pap IG w/ reflex to HPV when ASC-U     Status: None   Collection Time: 05/21/15  5:35 PM  Result Value Ref Range   Specimen adequacy: SEE NOTE     Comment: SATISFACTORY.  Endocervical/transformation zone component absent/insufficient.    FINAL DIAGNOSIS: SEE NOTE     Comment: - NEGATIVE FOR INTRAEPITHELIAL LESIONS OR MALIGNANCY.    COMMENTS: SEE NOTE     Comment: Shift in flora suggestive of bacterial vaginosis.   Cytotechnologist: SEE NOTE     Comment: EW, BA CT(ASCP) * The Pap smear is  a screening test used to detect cervical cancer and its precursors.  It should not be used as the sole means to detect cervical cancer.  Test results should be correlated with clinical findings.  The Pap test is unreliable for detecting endometrial lesions and should not be used to evaluate endometrial abnormalities. Data indicate the Pap test is subject to false negative and false positive results.  Therefore, periodic repeat testing and follow-up of any unexplained clinical signs and symptoms are recommended.   Dr. Elisha PonderValerie Fields, Cytology Medical Director   POCT Wet Prep with KOH     Status: None   Collection Time: 05/21/15  5:45 PM  Result Value Ref Range   Trichomonas, UA Negative    Clue Cells Wet Prep HPF POC neg    Epithelial Wet Prep HPF POC 3-10    Yeast Wet Prep HPF POC neg    Bacteria Wet Prep HPF POC 1+    RBC Wet Prep HPF POC neg    WBC Wet Prep HPF POC 0-2    KOH Prep POC Negative    Procedure: Verbal consent obtained.  Patient anesthetized with 2% lidocaine with.  Sterile prep and drape.  3 mm punch taken from periphery of rash. Of note, patient did have a merciful syncope reaction to the sight of blood.  She was  Placed in the supine position with legs elevated and cool compress was placed on her forehead.  Patient made a full recovery, without incident, within 3 minutes.  She is to return to clinic in 10 days for suture removal.     ASSESSMENT & PLAN  Envy was seen today for annual exam and medication refill.  Diagnoses and all orders for this visit:  Annual physical exam Orders: -     Cancel: Cytology - PAP ([place]) -     Cancel: Pap IG w/ reflex to HPV when ASC-U  Rash and nonspecific skin eruption Orders: -     Dermatology pathology  Medication refill Orders: -     levothyroxine (SYNTHROID, LEVOTHROID) 50 MCG tablet; Take 1 tablet (50 mcg total) by mouth daily before breakfast.  Screening for deficiency anemia Orders: -     POCT  CBC  Screening for nephropathy Orders: -     Comprehensive metabolic panel -     POCT urinalysis dipstick  Screening for diabetes mellitus (DM) Orders: -     Hemoglobin A1c  Thyroid disorder screen Orders: -     TSH  Lipid screening Orders: -     Lipid panel  Breast screening Orders: -     Mammogram Digital Screening; Future  Special screening for malignant neoplasms, colon Orders: -     Ambulatory referral to Gastroenterology  Need for Tdap vaccination Orders: -     Tdap vaccine greater than or equal to 7yo IM  Cervical cancer screening Orders: -     Pap IG w/ reflex to HPV when ASC-U  Routine screening for STI (sexually transmitted infection) Orders: -     POCT Wet Prep with KOH    The patient was advised to call or come back to clinic if she does not see an improvement in symptoms, or worsens with the above plan.   [name], MHS, PA-C [facility] 05/23/2015 1:41 PM

## 2015-05-21 NOTE — Progress Notes (Signed)
   Subjective:    Patient ID: Amanda Kemp, female    DOB: 05/30/1951, 64 y.o.   MRN: [id number]  HPI    Review of Systems  Constitutional: Negative.   HENT: Positive for ear pain and sinus pressure.   Eyes: Negative.   Respiratory: Negative.   Cardiovascular: Negative.   Gastrointestinal: Positive for nausea.  Endocrine: Negative.   Genitourinary: Positive for hematuria.  Musculoskeletal: Positive for back pain, neck pain and neck stiffness.  Skin: Negative.   Allergic/Immunologic: Negative.   Neurological: Positive for dizziness and headaches.  Hematological: Negative.   Psychiatric/Behavioral: Negative.        Objective:   Physical Exam        Assessment & Plan:

## 2015-05-22 LAB — PAP IG W/ RFLX HPV ASCU

## 2015-05-22 LAB — TSH: TSH: 6.643 u[IU]/mL — ABNORMAL HIGH (ref 0.350–4.500)

## 2015-05-26 ENCOUNTER — Other Ambulatory Visit: Payer: Self-pay | Admitting: Physician Assistant

## 2015-05-26 DIAGNOSIS — E039 Hypothyroidism, unspecified: Secondary | ICD-10-CM

## 2015-05-28 ENCOUNTER — Encounter: Payer: Self-pay | Admitting: Family Medicine

## 2015-06-06 ENCOUNTER — Other Ambulatory Visit: Payer: Self-pay

## 2015-06-06 DIAGNOSIS — Z1231 Encounter for screening mammogram for malignant neoplasm of breast: Secondary | ICD-10-CM

## 2015-06-19 ENCOUNTER — Ambulatory Visit
Admission: RE | Admit: 2015-06-19 | Discharge: 2015-06-19 | Disposition: A | Discharge status: Home / Self Care | Payer: BLUE CROSS/BLUE SHIELD | Source: Ambulatory Visit | Attending: Physician Assistant | Admitting: Physician Assistant

## 2015-06-19 ENCOUNTER — Ambulatory Visit (INDEPENDENT_AMBULATORY_CARE_PROVIDER_SITE_OTHER): Payer: BLUE CROSS/BLUE SHIELD | Admitting: Physician Assistant

## 2015-06-19 VITALS — BP 132/78 | HR 78 | Temp 97.9°F | Resp 16 | Ht 65.0 in | Wt 174.8 lb

## 2015-06-19 DIAGNOSIS — Z1231 Encounter for screening mammogram for malignant neoplasm of breast: Secondary | ICD-10-CM

## 2015-06-19 DIAGNOSIS — Z4802 Encounter for removal of sutures: Secondary | ICD-10-CM

## 2015-06-19 NOTE — Progress Notes (Signed)
   06/19/2015 at 4:01 PM  Elray McgregorBarbara B Julson / DOB: 12-Feb-1951 / MRN: [id number]  The patient has Essential hypertension and Environmental and seasonal allergies on her problem list.  SUBJECTIVE  Chief complaint: Suture / Staple Removal   Patient here for suture removal s/p punch biopsy.  Denies complications at the sight of the punch and feels well today.    She  has a past medical history of Thyroid disease; Allergy; and Hypertension.    Medications reviewed and updated by myself where necessary, and exist elsewhere in the encounter.   [name] has No Known Allergies. She  reports that she has never smoked. She does not have any smokeless tobacco history on file. She reports that she does not drink alcohol or use illicit drugs. She  reports that she currently engages in sexual activity. The patient  has no past surgical history on file.  Her family history includes Cancer in her brother, mother, sister, and sister; Hyperlipidemia in her mother; Hypertension in her mother.  Review of Systems  Constitutional: Negative for fever.  Skin: Negative for rash.  Neurological: Negative for dizziness.    OBJECTIVE  Her  height is 5\' 5"  (1.651 m) and weight is 174 lb 12.8 oz (79.289 kg). Her oral temperature is 97.9 F (36.6 C). Her blood pressure is 132/78 and her pulse is 78. Her respiration is 16 and oxygen saturation is 99%.  The patient's body mass index is 29.09 kg/(m^2).  Physical Exam  Vitals reviewed. Constitutional: Vital signs are normal.  Cardiovascular: Normal rate.   Respiratory: Effort normal.  Skin:       No results found for this or any previous visit (from the past 24 hour(s)).  ASSESSMENT & PLAN  There are no diagnoses linked to this encounter.  The patient was advised to call or come back to clinic if she does not see an improvement in symptoms, or worsens with the above plan.   Deliah BostonMichael Clark, MHS, PA-C [facility] 06/19/2015 4:01 PM

## 2015-06-20 ENCOUNTER — Other Ambulatory Visit: Payer: Self-pay | Admitting: Physician Assistant

## 2015-06-20 DIAGNOSIS — R928 Other abnormal and inconclusive findings on diagnostic imaging of breast: Secondary | ICD-10-CM

## 2015-06-21 ENCOUNTER — Encounter: Payer: Self-pay | Admitting: Internal Medicine

## 2015-06-22 ENCOUNTER — Other Ambulatory Visit: Payer: Self-pay

## 2015-06-22 NOTE — Telephone Encounter (Signed)
Casimiro NeedleMichael, pharm sent req for you to write Rx for pt's lovastatin, if you are willing to start writing this. Originally ordered by different provider, but I do see that you just saw pt and ran a lipid panel. Pended.

## 2015-06-23 MED ORDER — LOVASTATIN 20 MG PO TABS: 20.0000 mg | ORAL_TABLET | Freq: Every day | ORAL | Status: DC

## 2015-07-21 ENCOUNTER — Telehealth: Payer: Self-pay | Admitting: Family Medicine

## 2015-07-21 NOTE — Telephone Encounter (Signed)
Please reschedule this patient app that she had with [name] in dec for a follow up elevated blood sugar

## 2015-07-23 ENCOUNTER — Ambulatory Visit
Admission: RE | Admit: 2015-07-23 | Discharge: 2015-07-23 | Disposition: A | Discharge status: Home / Self Care | Payer: BLUE CROSS/BLUE SHIELD | Source: Ambulatory Visit | Attending: Physician Assistant | Admitting: Physician Assistant

## 2015-07-23 DIAGNOSIS — R928 Other abnormal and inconclusive findings on diagnostic imaging of breast: Secondary | ICD-10-CM

## 2015-11-19 ENCOUNTER — Ambulatory Visit: Payer: BLUE CROSS/BLUE SHIELD | Admitting: Physician Assistant

## 2015-11-27 ENCOUNTER — Encounter: Payer: Self-pay | Admitting: Physician Assistant

## 2015-11-27 ENCOUNTER — Ambulatory Visit (INDEPENDENT_AMBULATORY_CARE_PROVIDER_SITE_OTHER): Payer: BLUE CROSS/BLUE SHIELD | Admitting: Physician Assistant

## 2015-11-27 VITALS — BP 135/78 | HR 80 | Temp 98.3°F | Resp 16 | Ht 64.5 in | Wt 171.8 lb

## 2015-11-27 DIAGNOSIS — R946 Abnormal results of thyroid function studies: Secondary | ICD-10-CM | POA: Diagnosis not present

## 2015-11-27 DIAGNOSIS — B9789 Other viral agents as the cause of diseases classified elsewhere: Secondary | ICD-10-CM

## 2015-11-27 DIAGNOSIS — Z139 Encounter for screening, unspecified: Secondary | ICD-10-CM | POA: Diagnosis not present

## 2015-11-27 DIAGNOSIS — Z1211 Encounter for screening for malignant neoplasm of colon: Secondary | ICD-10-CM

## 2015-11-27 DIAGNOSIS — R7989 Other specified abnormal findings of blood chemistry: Secondary | ICD-10-CM

## 2015-11-27 DIAGNOSIS — J069 Acute upper respiratory infection, unspecified: Secondary | ICD-10-CM

## 2015-11-27 DIAGNOSIS — Z0000 Encounter for general adult medical examination without abnormal findings: Secondary | ICD-10-CM | POA: Diagnosis not present

## 2015-11-27 LAB — T3, FREE: T3, Free: 2.7 pg/mL (ref 2.3–4.2)

## 2015-11-27 LAB — COMPLETE METABOLIC PANEL WITH GFR
ALT: 12 U/L (ref 6–29)
AST: 17 U/L (ref 10–35)
Albumin: 4.1 g/dL (ref 3.6–5.1)
Alkaline Phosphatase: 60 U/L (ref 33–130)
BUN: 7 mg/dL (ref 7–25)
CALCIUM: 9.2 mg/dL (ref 8.6–10.4)
CHLORIDE: 104 mmol/L (ref 98–110)
CO2: 24 mmol/L (ref 20–31)
Creat: 0.83 mg/dL (ref 0.50–0.99)
GFR, Est African American: 87 mL/min (ref >=60)
GFR, Est Non African American: 75 mL/min (ref >=60)
Glucose, Bld: 79 mg/dL (ref 65–99)
POTASSIUM: 4.3 mmol/L (ref 3.5–5.3)
Sodium: 143 mmol/L (ref 135–146)
Total Bilirubin: 0.3 mg/dL (ref 0.2–1.2)
Total Protein: 7.2 g/dL (ref 6.1–8.1)

## 2015-11-27 LAB — CBC
HCT: 40 % (ref 36.0–46.0)
Hemoglobin: 13 g/dL (ref 12.0–15.0)
MCH: 26.1 pg (ref 26.0–34.0)
MCHC: 32.5 g/dL (ref 30.0–36.0)
MCV: 80.2 fL (ref 78.0–100.0)
MPV: 9.5 fL (ref 8.6–12.4)
Platelets: 353 10*3/uL (ref 150–400)
RBC: 4.99 MIL/uL (ref 3.87–5.11)
RDW: 14.7 % (ref 11.5–15.5)
WBC: 7.7 10*3/uL (ref 4.0–10.5)

## 2015-11-27 LAB — TSH: TSH: 10.052 u[IU]/mL — AB (ref 0.350–4.500)

## 2015-11-27 LAB — T4, FREE: FREE T4: 0.69 ng/dL — AB (ref 0.80–1.80)

## 2015-11-27 MED ORDER — HYDROCODONE-HOMATROPINE 5-1.5 MG/5ML PO SYRP: 2.5000 mL | ORAL_SOLUTION | Freq: Every evening | ORAL | Status: DC | PRN

## 2015-11-27 MED ORDER — ACETAMINOPHEN 500 MG PO TABS: 1000.0000 mg | ORAL_TABLET | Freq: Four times a day (QID) | ORAL | Status: DC | PRN

## 2015-11-27 MED ORDER — CETIRIZINE HCL 10 MG PO TABS: 10.0000 mg | ORAL_TABLET | Freq: Every day | ORAL | Status: DC

## 2015-11-27 NOTE — Progress Notes (Signed)
12/02/2015 9:56 AM   DOB: [birth year] / MRN: [id number]  SUBJECTIVE:  Amanda Kemp is a 64 y.o. female presenting for follow up on an elevated TSH and routine heatlh maintenance. She is due for a colonoscopy.    She is also here today for the evaluation of runny nose, congestion, cough and sore throat that started 4 days ago.  Associated symptoms include fever today and she denies headache and jaw pain. Treatments tried thus far include OTC preps without relief. She reports sick contacts.   She has No Known Allergies.   She  has a past medical history of Thyroid disease; Allergy; and Hypertension.    She  reports that she has never smoked. She does not have any smokeless tobacco history on file. She reports that she does not drink alcohol or use illicit drugs. She  reports that she currently engages in sexual activity. The patient  has no past surgical history on file.  Her family history includes Cancer in her brother, mother, sister, and sister; Hyperlipidemia in her mother; Hypertension in her mother.  Review of Systems  Constitutional: Positive for malaise/fatigue. Negative for fever, chills and diaphoresis.  HENT: Positive for congestion and sore throat.   Respiratory: Positive for cough. Negative for hemoptysis, shortness of breath and wheezing.   Cardiovascular: Negative for chest pain.  Gastrointestinal: Negative for nausea.  Skin: Negative for rash.  Neurological: Negative for dizziness and weakness.  Endo/Heme/Allergies: Negative for polydipsia.    Problem list and medications reviewed and updated by myself where necessary, and exist elsewhere in the encounter.   OBJECTIVE:  BP 135/78 mmHg  Pulse 80  Temp(Src) 98.3 F (36.8 C) (Oral)  Resp 16  Ht 5' 4.5" (1.638 m)  Wt 171 lb 12.8 oz (77.928 kg)  BMI 29.04 kg/m2  SpO2 99% Estimated Creatinine Clearance: 70.9 mL/min (by C-G formula based on Cr of 0.83).  Physical Exam  Constitutional: She is oriented to  person, place, and time. She appears well-developed.  Eyes: EOM are normal. Pupils are equal, round, and reactive to light.  Cardiovascular: Normal rate.   Pulmonary/Chest: Effort normal.  Abdominal: She exhibits no distension.  Musculoskeletal: Normal range of motion.  Neurological: She is alert and oriented to person, place, and time. No cranial nerve deficit.  Skin: Skin is warm and dry. She is not diaphoretic.  Psychiatric: She has a normal mood and affect.  Vitals reviewed.   No results found for this or any previous visit (from the past 48 hour(s)).  ASSESSMENT AND PLAN  [name] was seen today for cough, sinus problem and sinus drainage.  Diagnoses and all orders for this visit:  Healthcare maintenance -     HIV antibody -     Hepatitis C antibody  Elevated TSH -     TSH -     T3, Free -     T4, Free -     Thyroid Peroxidase Antibody  Screening -     CBC -     COMPLETE METABOLIC PANEL WITH GFR -     Hemoglobin A1c  Viral URI with cough -     Discontinue: HYDROcodone-homatropine (HYCODAN) 5-1.5 MG/5ML syrup; Take 2.5-5 mLs by mouth at bedtime as needed. -     acetaminophen (TYLENOL) 500 MG tablet; Take 2 tablets (1,000 mg total) by mouth every 6 (six) hours as needed. -     cetirizine (ZYRTEC) 10 MG tablet; Take 1 tablet (10 mg total) by mouth daily. -  HYDROcodone-homatropine (HYCODAN) 5-1.5 MG/5ML syrup; Take 2.5-5 mLs by mouth at bedtime as needed.  Special screening for malignant neoplasms, colon -     Ambulatory referral to Gastroenterology  Other orders -     Cancel: Ambulatory referral to Gastroenterology    The patient was advised to call or return to clinic if she does not see an improvement in symptoms or to seek the care of the closest emergency department if she worsens with the above plan.   [name], MHS, PA-C [facility] 12/02/2015 9:56 AM

## 2015-11-28 ENCOUNTER — Other Ambulatory Visit: Payer: Self-pay | Admitting: Physician Assistant

## 2015-11-28 LAB — HEMOGLOBIN A1C
HEMOGLOBIN A1C: 6 % — AB (ref <5.7)
MEAN PLASMA GLUCOSE: 126 mg/dL — AB (ref <117)

## 2015-11-28 LAB — HIV ANTIBODY (ROUTINE TESTING W REFLEX): HIV: NONREACTIVE

## 2015-11-28 LAB — HEPATITIS C ANTIBODY: HCV AB: NEGATIVE

## 2015-11-28 LAB — THYROID PEROXIDASE ANTIBODY: THYROID PEROXIDASE ANTIBODY: 181 [IU]/mL — AB (ref <9)

## 2015-11-28 MED ORDER — LEVOTHYROXINE SODIUM 75 MCG PO TABS: ORAL_TABLET | ORAL | Status: DC

## 2015-12-31 ENCOUNTER — Encounter: Payer: Self-pay | Admitting: Internal Medicine

## 2016-01-17 ENCOUNTER — Other Ambulatory Visit: Payer: Self-pay | Admitting: Physician Assistant

## 2016-02-06 ENCOUNTER — Other Ambulatory Visit: Payer: Self-pay | Admitting: Physician Assistant

## 2016-02-09 ENCOUNTER — Encounter: Payer: Self-pay | Admitting: Family Medicine

## 2016-02-09 ENCOUNTER — Ambulatory Visit (INDEPENDENT_AMBULATORY_CARE_PROVIDER_SITE_OTHER): Payer: BLUE CROSS/BLUE SHIELD | Admitting: Family Medicine

## 2016-02-09 VITALS — BP 128/80 | HR 67 | Temp 98.1°F | Resp 16 | Ht 64.75 in | Wt 181.0 lb

## 2016-02-09 DIAGNOSIS — E039 Hypothyroidism, unspecified: Secondary | ICD-10-CM | POA: Diagnosis not present

## 2016-02-09 DIAGNOSIS — E785 Hyperlipidemia, unspecified: Secondary | ICD-10-CM | POA: Diagnosis not present

## 2016-02-09 DIAGNOSIS — I10 Essential (primary) hypertension: Secondary | ICD-10-CM | POA: Diagnosis not present

## 2016-02-09 LAB — LIPID PANEL
CHOL/HDL RATIO: 6.1 ratio — AB (ref <=5.0)
Cholesterol: 291 mg/dL — ABNORMAL HIGH (ref 125–200)
HDL: 48 mg/dL (ref >=46)
LDL CALC: 218 mg/dL — AB (ref <130)
Triglycerides: 125 mg/dL (ref <150)
VLDL: 25 mg/dL (ref <30)

## 2016-02-09 LAB — TSH: TSH: 4.65 m[IU]/L — AB

## 2016-02-09 MED ORDER — LOVASTATIN 20 MG PO TABS: 20.0000 mg | ORAL_TABLET | Freq: Every day | ORAL | Status: DC

## 2016-02-09 MED ORDER — LISINOPRIL 10 MG PO TABS: 10.0000 mg | ORAL_TABLET | Freq: Every day | ORAL | Status: DC

## 2016-02-09 NOTE — Progress Notes (Signed)
Subjective:  By signing my name below, I, Amanda Kemp, attest that this documentation has been prepared under the direction and in the presence of Amanda Sorenson, MD.  Amanda Kemp, Medical Scribe. 02/09/2016.  8:31 AM.   Patient ID: Amanda Kemp, female    DOB: December 22, 1950, 65 y.o.   MRN: [id number]  Chief Complaint  Patient presents with  . Medication Refill    Lisinipril    HPI HPI Comments: Amanda Kemp is a 65 y.o. female who presents to [facility] requesting a medication refill.  Pt was seen 2 months prior for a CPE. Pt is on Lisinopril 10 mg. They did increase her synthroid from 50 to 75 micrograms, so she is due to repeat TSH today. BP has been well controlled. She has gained 10 lbs since her last visit. Her TSH was 10.   Pt reports that she has not experienced any side effects with the change of synthroid. She indicates that she ran out of her blood pressure medication several weeks ago, however she notes she goes to the pharmacy and gets few pills at a time. Her last intake of the medication was yesterday. Pt states that she has been trying to control her diet by decreasing her salt intake. She reports having symptoms of mild headaches. She denies cough, chest pain, SOB, or swelling in the legs.   Pt is also requesting a medication refill for lovastatin.   Pt still has not followed up with  for her colonoscopy due to the possibility of them calling her but she did not recognize the number therefore never contacted them back.    Patient Active Problem List   Diagnosis Date Noted  . Essential hypertension 05/08/2015  . Environmental and seasonal allergies 05/08/2015   Past Medical History  Diagnosis Date  . Thyroid disease   . Allergy   . Hypertension    History reviewed. No pertinent past surgical history. No Known Allergies Prior to Admission medications   Medication Sig Start Date End Date Taking? Authorizing Provider    cetirizine (ZYRTEC) 10 MG tablet Take 1 tablet (10 mg total) by mouth daily. 11/27/15  Yes Ofilia Neas, PA-C  levothyroxine (SYNTHROID) 75 MCG tablet Take one daily. Please save your 50 mcg tabs for medication titration purposes. Lab recheck in on Jan 08 2015. 11/28/15  Yes Ofilia Neas, PA-C  lisinopril (PRINIVIL,ZESTRIL) 10 MG tablet TAKE ONE TABLET BY MOUTH ONCE DAILY 02/07/16  Yes Ofilia Neas, PA-C  lovastatin (MEVACOR) 20 MG tablet Take 1 tablet (20 mg total) by mouth at bedtime. 06/23/15  Yes Ofilia Neas, PA-C  acetaminophen (TYLENOL) 500 MG tablet Take 2 tablets (1,000 mg total) by mouth every 6 (six) hours as needed. Patient not taking: Reported on 02/09/2016 11/27/15   Ofilia Neas, PA-C  HYDROcodone-homatropine [facility]) 5-1.5 MG/5ML syrup Take 2.5-5 mLs by mouth at bedtime as needed. Patient not taking: Reported on 02/09/2016 11/27/15   Ofilia Neas, PA-C   Social History   Social History  . Marital Status: Divorced    Spouse Name: N/A  . Number of Children: N/A  . Years of Education: N/A   Occupational History  . Inspector    Social History Main Topics  . Smoking status: Never Smoker   . Smokeless tobacco: Not on file  . Alcohol Use: No  . Drug Use: No  . Sexual Activity: Yes   Other Topics Concern  . Not on  file   Social History Narrative   Divorced. Education: [employer]. Exercise: Some.      Depression screen [facility] 2/9 02/09/2016 11/27/2015 05/21/2015 05/08/2015  Decreased Interest 0 0 0 0  Down, Depressed, Hopeless 0 0 0 0  PHQ - 2 Score 0 0 0 0      Review of Systems  Constitutional: Negative for activity change, appetite change, fatigue and unexpected weight change.  Respiratory: Negative for cough, chest tightness and shortness of breath.   Cardiovascular: Negative for chest pain, palpitations and leg swelling.  Gastrointestinal: Negative for vomiting.  Skin: Negative for color change.  Allergic/Immunologic: Positive for environmental  allergies.  Neurological: Positive for headaches. Negative for weakness.  Hematological: Does not bruise/bleed easily.  Psychiatric/Behavioral: Negative for dysphoric mood.      Objective:   Physical Exam  Constitutional: She is oriented to person, place, and time. She appears well-developed and well-nourished. No distress.  HENT:  Head: Normocephalic and atraumatic.  Eyes: EOM are normal. Pupils are equal, round, and reactive to light.  Neck: Neck supple.  Thyroid exam is normal. No lymphadenopathy.   Cardiovascular: Normal rate, regular rhythm, S1 normal, S2 normal and normal heart sounds.  Exam reveals no gallop and no friction rub.   No murmur heard. Pulmonary/Chest: Effort normal and breath sounds normal. No respiratory distress. She has no wheezes. She has no rales.  Neurological: She is alert and oriented to person, place, and time. No cranial nerve deficit.  Skin: Skin is warm and dry.  Psychiatric: She has a normal mood and affect. Her behavior is normal.  Nursing note and vitals reviewed.   BP 128/80 mmHg  Pulse 67  Temp(Src) 98.1 F (36.7 C) (Oral)  Resp 16  Ht 5' 4.75" (1.645 m)  Wt 181 lb (82.101 kg)  BMI 30.34 kg/m2  SpO2 99%     Assessment & Plan:   1. Essential hypertension   2. Hypothyroidism, unspecified hypothyroidism type   3. Hyperlipidemia   Refilled chronic meds meds  On levothyroxine 75 - tsh still mildly elev so increase to 88 mcg.  Orders Placed This Encounter  Procedures  . TSH  . Lipid panel    Order Specific Question:  Has the patient fasted?    Answer:  Yes    Meds ordered this encounter  Medications  . lisinopril (PRINIVIL,ZESTRIL) 10 MG tablet    Sig: Take 1 tablet (10 mg total) by mouth daily.    Dispense:  90 tablet    Refill:  3  . lovastatin (MEVACOR) 20 MG tablet    Sig: Take 1 tablet (20 mg total) by mouth at bedtime.    Dispense:  90 tablet    Refill:  1    I personally performed the services described in this  documentation, which was scribed in my presence. The recorded information has been reviewed and considered, and addended by me as needed.  Amanda Sorenson, MD MPH  Results for orders placed or performed in visit on 02/09/16  TSH  Result Value Ref Range   TSH 4.65 (H) mIU/L  Lipid panel  Result Value Ref Range   Cholesterol 291 (H) 125 - 200 mg/dL   Triglycerides 098 <119 mg/dL   HDL 48 >=14 mg/dL   Total CHOL/HDL Ratio 6.1 (H) <=5.0 Ratio   VLDL 25 <30 mg/dL   LDL Cholesterol 782 (H) <130 mg/dL

## 2016-02-09 NOTE — Patient Instructions (Signed)
Call [facility] to schedule an appointment in the office to talk with a doctor about getting a colonoscopy - call [phone]  Heart-Healthy Eating Plan Many factors influence your heart health, including eating and exercise habits. Heart (coronary) risk increases with abnormal blood fat (lipid) levels. Heart-healthy meal planning includes limiting unhealthy fats, increasing healthy fats, and making other small dietary changes. This includes maintaining a healthy body weight to help keep lipid levels within a normal range. WHAT IS MY PLAN?  Your health care provider recommends that you:  Get no more than _________% of the total calories in your daily diet from fat.  Limit your intake of saturated fat to less than _________% of your total calories each day.  Limit the amount of cholesterol in your diet to less than _________ mg per day. WHAT TYPES OF FAT SHOULD I CHOOSE?  Choose healthy fats more often. Choose monounsaturated and polyunsaturated fats, such as olive oil and canola oil, flaxseeds, walnuts, almonds, and seeds.  Eat more omega-3 fats. Good choices include salmon, mackerel, sardines, tuna, flaxseed oil, and ground flaxseeds. Aim to eat fish at least two times each week.  Limit saturated fats. Saturated fats are primarily found in animal products, such as meats, butter, and cream. Plant sources of saturated fats include palm oil, palm kernel oil, and coconut oil.  Avoid foods with partially hydrogenated oils in them. These contain trans fats. Examples of foods that contain trans fats are stick margarine, some tub margarines, cookies, crackers, and other baked goods. WHAT GENERAL GUIDELINES DO I NEED TO FOLLOW?  Check food labels carefully to identify foods with trans fats or high amounts of saturated fat.  Fill one half of your plate with vegetables and green salads. Eat 4-5 servings of vegetables per day. A serving of vegetables equals 1 cup of raw leafy vegetables,  cup of raw  or cooked cut-up vegetables, or  cup of vegetable juice.  Fill one fourth of your plate with whole grains. Look for the word "whole" as the first word in the ingredient list.  Fill one fourth of your plate with lean protein foods.  Eat 4-5 servings of fruit per day. A serving of fruit equals one medium whole fruit,  cup of dried fruit,  cup of fresh, frozen, or canned fruit, or  cup of 100% fruit juice.  Eat more foods that contain soluble fiber. Examples of foods that contain this type of fiber are apples, broccoli, carrots, beans, peas, and barley. Aim to get 20-30 g of fiber per day.  Eat more home-cooked food and less restaurant, buffet, and fast food.  Limit or avoid alcohol.  Limit foods that are high in starch and sugar.  Avoid fried foods.  Cook foods by using methods other than frying. Baking, boiling, grilling, and broiling are all great options. Other fat-reducing suggestions include:  Removing the skin from poultry.  Removing all visible fats from meats.  Skimming the fat off of stews, soups, and gravies before serving them.  Steaming vegetables in water or broth.  Lose weight if you are overweight. Losing just 5-10% of your initial body weight can help your overall health and prevent diseases such as diabetes and heart disease.  Increase your consumption of nuts, legumes, and seeds to 4-5 servings per week. One serving of dried beans or legumes equals  cup after being cooked, one serving of nuts equals 1 ounces, and one serving of seeds equals  ounce or 1 tablespoon.  You may need  to monitor your salt (sodium) intake, especially if you have high blood pressure. Talk with your health care provider or dietitian to get more information about reducing sodium. WHAT FOODS CAN I EAT? Grains Breads, including [place], white, pita, wheat, raisin, rye, oatmeal, and [place]. Tortillas that are neither fried nor made with lard or trans fat. Low-fat rolls, including hotdog  and hamburger buns and English muffins. Biscuits. Muffins. Waffles. Pancakes. Light popcorn. Whole-grain cereals. Flatbread. Melba toast. Pretzels. Breadsticks. Rusks. Low-fat snacks and crackers, including oyster, saltine, matzo, graham, animal, and rye. Rice and pasta, including brown rice and those that are made with whole wheat. Vegetables All vegetables. Fruits All fruits, but limit coconut. Meats and Other Protein Sources Lean, well-trimmed beef, veal, pork, and lamb. Chicken and [place] without skin. All fish and shellfish. Wild duck, rabbit, pheasant, and venison. Egg whites or low-cholesterol egg substitutes. Dried beans, peas, lentils, and tofu.Seeds and most nuts. Dairy Low-fat or nonfat cheeses, including ricotta, string, and mozzarella. Skim or 1% milk that is liquid, powdered, or evaporated. Buttermilk that is made with low-fat milk. Nonfat or low-fat yogurt. Beverages Mineral water. Diet carbonated beverages. Sweets and Desserts Sherbets and fruit ices. Honey, jam, marmalade, jelly, and syrups. Meringues and gelatins. Pure sugar candy, such as hard candy, jelly beans, gumdrops, mints, marshmallows, and small amounts of dark chocolate. [employer]. Eat all sweets and desserts in moderation. Fats and Oils Nonhydrogenated (trans-free) margarines. Vegetable oils, including soybean, sesame, sunflower, olive, peanut, safflower, corn, canola, and cottonseed. Salad dressings or mayonnaise that are made with a vegetable oil. Limit added fats and oils that you use for cooking, baking, salads, and as spreads. Other Cocoa powder. Coffee and tea. All seasonings and condiments. The items listed above may not be a complete list of recommended foods or beverages. Contact your dietitian for more options. WHAT FOODS ARE NOT RECOMMENDED? Grains Breads that are made with saturated or trans fats, oils, or whole milk. Croissants. Butter rolls. Cheese breads. Sweet rolls. Donuts. Buttered popcorn.  Chow mein noodles. High-fat crackers, such as cheese or butter crackers. Meats and Other Protein Sources Fatty meats, such as hotdogs, short ribs, sausage, spareribs, bacon, ribeye roast or steak, and mutton. High-fat deli meats, such as salami and bologna. Caviar. Domestic duck and goose. Organ meats, such as kidney, liver, sweetbreads, brains, gizzard, chitterlings, and heart. Dairy Cream, sour cream, cream cheese, and creamed cottage cheese. Whole milk cheeses, including blue (bleu), [address], [place], [place], [address], [place], [address], [place], [place], and [place]. Whole or 2% milk that is liquid, evaporated, or condensed. Whole buttermilk. Cream sauce or high-fat cheese sauce. Yogurt that is made from whole milk. Beverages Regular sodas and drinks with added sugar. Sweets and Desserts Frosting. Pudding. Cookies. Cakes other than angel food cake. Candy that has milk chocolate or white chocolate, hydrogenated fat, butter, coconut, or unknown ingredients. Buttered syrups. Full-fat ice cream or ice cream drinks. Fats and Oils Gravy that has suet, meat fat, or shortening. Cocoa butter, hydrogenated oils, palm oil, coconut oil, palm kernel oil. These can often be found in baked products, candy, fried foods, nondairy creamers, and whipped toppings. Solid fats and shortenings, including bacon fat, salt pork, lard, and butter. Nondairy cream substitutes, such as coffee creamers and sour cream substitutes. Salad dressings that are made of unknown oils, cheese, or sour cream. The items listed above may not be a complete list of foods and beverages to avoid. Contact your dietitian for more information.   This information is not  intended to replace advice given to you by your health care provider. Make sure you discuss any questions you have with your health care provider.   Document Released: 09/09/2008 Document Revised: 12/22/2014 Document Reviewed: 05/25/2014 Elsevier Interactive Patient Education  [employer]

## 2016-02-10 MED ORDER — LEVOTHYROXINE SODIUM 88 MCG PO TABS: 88.0000 ug | ORAL_TABLET | Freq: Every day | ORAL | Status: DC

## 2016-02-28 ENCOUNTER — Ambulatory Visit (INDEPENDENT_AMBULATORY_CARE_PROVIDER_SITE_OTHER): Payer: BLUE CROSS/BLUE SHIELD | Admitting: Physician Assistant

## 2016-02-28 VITALS — BP 110/68 | HR 86 | Temp 98.1°F | Resp 16 | Ht 65.35 in | Wt 181.4 lb

## 2016-02-28 DIAGNOSIS — R0981 Nasal congestion: Secondary | ICD-10-CM | POA: Diagnosis not present

## 2016-02-28 DIAGNOSIS — J302 Other seasonal allergic rhinitis: Secondary | ICD-10-CM

## 2016-02-28 MED ORDER — CETIRIZINE HCL 10 MG PO TABS: 10.0000 mg | ORAL_TABLET | Freq: Every day | ORAL | Status: DC

## 2016-02-28 MED ORDER — METHYLPREDNISOLONE ACETATE 80 MG/ML IJ SUSP
80.0000 mg | Freq: Once | INTRAMUSCULAR | Status: AC
  Administered 2016-02-28: 80 mg via INTRAMUSCULAR

## 2016-02-28 MED ORDER — FLUTICASONE PROPIONATE 50 MCG/ACT NA SUSP: 2.0000 | Freq: Every day | NASAL | Status: DC

## 2016-02-28 NOTE — Progress Notes (Signed)
02/28/2016 5:56 PM   DOB: [birth year] / MRN: [id number]  SUBJECTIVE:  Amanda Kemp is a 65 y.o. female presenting for nasal congestion, sneezing and HA that has been present for 5 days.  She feels awul.  She denies fever, cough, myalgia.    She has No Known Allergies.   She  has a past medical history of Thyroid disease; Allergy; and Hypertension.    She  reports that she has never smoked. She does not have any smokeless tobacco history on file. She reports that she does not drink alcohol or use illicit drugs. She  reports that she currently engages in sexual activity. The patient  has no past surgical history on file.  Her family history includes Cancer in her brother, mother, sister, and sister; Hyperlipidemia in her mother; Hypertension in her mother.  Review of Systems  Constitutional: Negative for fever and chills.  Eyes: Negative for blurred vision.  Respiratory: Negative for cough and shortness of breath.   Cardiovascular: Negative for chest pain.  Gastrointestinal: Negative for nausea and abdominal pain.  Genitourinary: Negative for dysuria, urgency and frequency.  Musculoskeletal: Negative for myalgias.  Skin: Negative for rash.  Neurological: Negative for dizziness, tingling and headaches.  Psychiatric/Behavioral: Negative for depression. The patient is not nervous/anxious.     Problem list and medications reviewed and updated by myself where necessary, and exist elsewhere in the encounter.   OBJECTIVE:  BP 110/68 mmHg  Pulse 86  Temp(Src) 98.1 F (36.7 C) (Oral)  Resp 16  Ht 5' 5.35" (1.66 m)  Wt 181 lb 6.4 oz (82.283 kg)  BMI 29.86 kg/m2  SpO2 98%  Physical Exam  Constitutional: She is oriented to person, place, and time. She appears well-developed.  HENT:  Right Ear: Tympanic membrane normal.  Left Ear: Tympanic membrane normal.  Nose: Mucosal edema (severe, bilateral) present.  Mouth/Throat: Uvula is midline, oropharynx is clear and moist and mucous  membranes are normal.  Eyes: EOM are normal. Pupils are equal, round, and reactive to light.  Cardiovascular: Normal rate.   Pulmonary/Chest: Effort normal.  Abdominal: She exhibits no distension.  Musculoskeletal: Normal range of motion.  Neurological: She is alert and oriented to person, place, and time. No cranial nerve deficit.  Skin: Skin is warm and dry. She is not diaphoretic.  Psychiatric: She has a normal mood and affect.  Vitals reviewed.   No results found for this or any previous visit (from the past 72 hour(s)).  No results found.  Lab Results  Component Value Date   HGBA1C 6.0* 11/27/2015   Lab Results  Component Value Date   CREATININE 0.83 11/27/2015     ASSESSMENT AND PLAN  Amanda Kemp was seen today for medication refill, facial pain and headache.  Diagnoses and all orders for this visit:  Nasal congestion -     fluticasone (FLONASE) 50 MCG/ACT nasal spray; Place 2 sprays into both nostrils daily. -     cetirizine (ZYRTEC) 10 MG tablet; Take 1 tablet (10 mg total) by mouth daily.  Seasonal allergies -     methylPREDNISolone acetate (DEPO-MEDROL) injection 80 mg; Inject 1 mL (80 mg total) into the muscle once.    The patient was advised to call or return to clinic if she does not see an improvement in symptoms or to seek the care of the closest emergency department if she worsens with the above plan.   Deliah BostonMichael Elzie Knisley, MHS, PA-C [facility] 02/28/2016 5:56  PM

## 2016-02-28 NOTE — Patient Instructions (Signed)
     IF you received an x-ray today, you will receive an invoice from [facility]. Please contact [facility] at [phone] with questions or concerns regarding your invoice.   IF you received labwork today, you will receive an invoice from [facility]. Please contact [facility] at [phone] with questions or concerns regarding your invoice.   Our billing staff will not be able to assist you with questions regarding bills from these companies.  You will be contacted with the lab results as soon as they are available. The fastest way to get your results is to activate your My Chart account. Instructions are located on the last page of this paperwork. If you have not heard from us regarding the results in 2 weeks, please contact this office.      

## 2016-03-05 ENCOUNTER — Ambulatory Visit (INDEPENDENT_AMBULATORY_CARE_PROVIDER_SITE_OTHER): Payer: BLUE CROSS/BLUE SHIELD | Admitting: Physician Assistant

## 2016-03-05 VITALS — BP 110/74 | HR 85 | Temp 98.1°F | Resp 20 | Ht 65.35 in | Wt 180.0 lb

## 2016-03-05 DIAGNOSIS — J069 Acute upper respiratory infection, unspecified: Secondary | ICD-10-CM | POA: Diagnosis not present

## 2016-03-05 DIAGNOSIS — R519 Headache, unspecified: Secondary | ICD-10-CM

## 2016-03-05 DIAGNOSIS — R51 Headache: Secondary | ICD-10-CM | POA: Diagnosis not present

## 2016-03-05 MED ORDER — NAPROXEN 500 MG PO TABS: 500.0000 mg | ORAL_TABLET | Freq: Two times a day (BID) | ORAL | Status: DC

## 2016-03-05 MED ORDER — KETOROLAC TROMETHAMINE 60 MG/2ML IM SOLN
60.0000 mg | Freq: Once | INTRAMUSCULAR | Status: AC
  Administered 2016-03-05: 60 mg via INTRAMUSCULAR

## 2016-03-05 MED ORDER — AMOXICILLIN-POT CLAVULANATE 875-125 MG PO TABS: 1.0000 | ORAL_TABLET | Freq: Two times a day (BID) | ORAL | Status: DC

## 2016-03-05 NOTE — Progress Notes (Signed)
03/05/2016 3:25 PM   DOB: 1951/07/01 / MRN: [id number]  SUBJECTIVE:  Amanda Kemp is a 65 y.o. female presenting for sinus headache that has been present for about 1 month.  I saw her roughly 2 weeks ago and she reports the depomedrol helped her some, but her symptoms returned roughly 1 week after administration. These symptoms began after a cold and have persisted.  She denies fever and teeth pain.  She feels she is getting worse.  She has been taking flonase and zyrtec without fail.   She has No Known Allergies.   She  has a past medical history of Thyroid disease; Allergy; and Hypertension.    She  reports that she has never smoked. She does not have any smokeless tobacco history on file. She reports that she does not drink alcohol or use illicit drugs. She  reports that she currently engages in sexual activity. The patient  has no past surgical history on file.  Her family history includes Cancer in her brother, mother, sister, and sister; Hyperlipidemia in her mother; Hypertension in her mother.  Review of Systems  Constitutional: Negative for fever and chills.  Eyes: Negative for blurred vision.  Respiratory: Negative for cough and shortness of breath.   Cardiovascular: Negative for chest pain.  Gastrointestinal: Negative for nausea and abdominal pain.  Genitourinary: Negative for dysuria, urgency and frequency.  Musculoskeletal: Negative for myalgias.  Skin: Negative for rash.  Neurological: Positive for headaches (mixillary and frontal). Negative for dizziness and tingling.  Psychiatric/Behavioral: Negative for depression. The patient is not nervous/anxious.     Problem list and medications reviewed and updated by myself where necessary, and exist elsewhere in the encounter.   OBJECTIVE:  BP 110/74 mmHg  Pulse 85  Temp(Src) 98.1 F (36.7 C) (Oral)  Resp 20  Ht 5' 5.35" (1.66 m)  Wt 180 lb (81.647 kg)  BMI 29.63 kg/m2  SpO2 98%  Lab Results  Component Value  Date   CREATININE 0.83 11/27/2015     Physical Exam  Constitutional: She is oriented to person, place, and time. She appears well-nourished. No distress.  HENT:  Head:    Nose: No mucosal edema or rhinorrhea.  Mouth/Throat: Uvula is midline, oropharynx is clear and moist and mucous membranes are normal.  Eyes: EOM are normal. Pupils are equal, round, and reactive to light.  Cardiovascular: Normal rate and regular rhythm.   Pulmonary/Chest: Effort normal. No respiratory distress. She has no wheezes. She has no rales. She exhibits no tenderness.  Abdominal: She exhibits no distension.  Neurological: She is alert and oriented to person, place, and time. No cranial nerve deficit. Gait normal.  Skin: Skin is dry. She is not diaphoretic.  Psychiatric: She has a normal mood and affect.  Vitals reviewed.   No results found for this or any previous visit (from the past 72 hour(s)).  No results found.  ASSESSMENT AND PLAN  Timmia was seen today for follow-up.  Diagnoses and all orders for this visit:  Protracted URI: She has been treated for viral URI and for allergies and her headache is persisting.  Will treat for ABRS.  Advised that if her ABx seems to be helped then she will need to refill this and take a total of 20 days of therapy.  I have asked that she call in 7 days to let me know how things are going.  If this fails will likely need to refer to a neurologist and we will have eliminated  a sinus etiology.   Sinus headache -     ketorolac (TORADOL) injection 60 mg; Inject 2 mLs (60 mg total) into the muscle once. -     amoxicillin-clavulanate (AUGMENTIN) 875-125 MG tablet; Take 1 tablet by mouth 2 (two) times daily. -     naproxen (NAPROSYN) 500 MG tablet; Take 1 tablet (500 mg total) by mouth 2 (two) times daily with a meal. Do not take ibuprofen, aleve, or aspirin with this medication.    The patient was advised to call or return to clinic if she does not see an improvement  in symptoms or to seek the care of the closest emergency department if she worsens with the above plan.   Deliah BostonMichael Kaede Clendenen, MHS, PA-C [facility] 03/05/2016 3:25 PM

## 2016-03-05 NOTE — Patient Instructions (Signed)
     IF you received an x-ray today, you will receive an invoice from [facility]. Please contact  Radiology at [phone] with questions or concerns regarding your invoice.   IF you received labwork today, you will receive an invoice from [facility]. Please contact [facility] at [phone] with questions or concerns regarding your invoice.   Our billing staff will not be able to assist you with questions regarding bills from these companies.  You will be contacted with the lab results as soon as they are available. The fastest way to get your results is to activate your My Chart account. Instructions are located on the last page of this paperwork. If you have not heard from us regarding the results in 2 weeks, please contact this office.      

## 2016-03-12 ENCOUNTER — Ambulatory Visit (INDEPENDENT_AMBULATORY_CARE_PROVIDER_SITE_OTHER): Payer: BLUE CROSS/BLUE SHIELD | Admitting: Family Medicine

## 2016-03-12 ENCOUNTER — Telehealth: Payer: Self-pay

## 2016-03-12 VITALS — BP 124/82 | HR 84 | Temp 98.6°F | Resp 17 | Ht 65.0 in | Wt 178.0 lb

## 2016-03-12 DIAGNOSIS — J329 Chronic sinusitis, unspecified: Secondary | ICD-10-CM

## 2016-03-12 DIAGNOSIS — R112 Nausea with vomiting, unspecified: Secondary | ICD-10-CM

## 2016-03-12 DIAGNOSIS — R197 Diarrhea, unspecified: Secondary | ICD-10-CM

## 2016-03-12 MED ORDER — RANITIDINE HCL 150 MG PO TABS: 150.0000 mg | ORAL_TABLET | Freq: Two times a day (BID) | ORAL | Status: DC

## 2016-03-12 MED ORDER — PROMETHAZINE HCL 12.5 MG PO TABS: 12.5000 mg | ORAL_TABLET | Freq: Three times a day (TID) | ORAL | Status: DC | PRN

## 2016-03-12 NOTE — Progress Notes (Signed)
By signing my name below, I, Stann Ore, attest that this documentation has been prepared under the direction and in the presence of Elvina Sidle, MD. Electronically Signed: Stann Ore, Scribe. 03/12/2016 , 2:52 PM .  Patient was seen in room 14 .   Patient ID: Amanda Kemp MRN: [id number], DOB: [birth year], 65 y.o. Date of Encounter: 03/12/2016  Primary Physician: No primary care provider on file.  Chief Complaint:  Chief Complaint  Patient presents with  . Abdominal Pain  . Nausea    HPI:  Amanda Kemp is a 65 y.o. female who [facility] to [facility] complaining of abdominal pain and nausea that started last night. She made some chicken with potatoes and peas after work last night. She had some abdominal pain with nausea, vomiting and diarrhea 2 hours after eating. She also notes her abdomen was bloated and distended. Her last episode of vomiting was at 6:00-7:00AM today.   She was seen previously for sinus pressure by [name]; given antibiotics for sinusitis.   She works at [employer] for [employer].   Past Medical History  Diagnosis Date  . Thyroid disease   . Allergy   . Hypertension      Home Meds: Prior to Admission medications   Medication Sig Start Date End Date Taking? Authorizing Provider  amoxicillin-clavulanate (AUGMENTIN) 875-125 MG tablet Take 1 tablet by mouth 2 (two) times daily. 03/05/16  Yes Ofilia Neas, PA-C  cetirizine (ZYRTEC) 10 MG tablet Take 1 tablet (10 mg total) by mouth daily. 02/28/16  Yes Ofilia Neas, PA-C  fluticasone (FLONASE) 50 MCG/ACT nasal spray Place 2 sprays into both nostrils daily. 02/28/16  Yes Ofilia Neas, PA-C  levothyroxine (SYNTHROID, LEVOTHROID) 88 MCG tablet Take 1 tablet (88 mcg total) by mouth daily before breakfast. 02/10/16  Yes Sherren Mocha, MD  lisinopril (PRINIVIL,ZESTRIL) 10 MG tablet Take 1 tablet (10 mg total) by mouth daily. 02/09/16  Yes Sherren Mocha, MD  lovastatin  (MEVACOR) 20 MG tablet Take 1 tablet (20 mg total) by mouth at bedtime. 02/09/16  Yes Sherren Mocha, MD  naproxen (NAPROSYN) 500 MG tablet Take 1 tablet (500 mg total) by mouth 2 (two) times daily with a meal. Do not take ibuprofen, aleve, or aspirin with this medication. 03/05/16 03/15/16 Yes Ofilia Neas, PA-C    Allergies: No Known Allergies  Social History   Social History  . Marital Status: Divorced    Spouse Name: N/A  . Number of Children: N/A  . Years of Education: N/A   Occupational History  . Inspector    Social History Main Topics  . Smoking status: Never Smoker   . Smokeless tobacco: Not on file  . Alcohol Use: No  . Drug Use: No  . Sexual Activity: Yes   Other Topics Concern  . Not on file   Social History Narrative   Divorced. Education: [employer]. Exercise: Some.        Review of Systems: Constitutional: negative for fever, chills, night sweats, weight changes; positive for fatigue  HEENT: negative for vision changes, hearing loss, congestion, rhinorrhea, ST, epistaxis; positive for sinus pressure Cardiovascular: negative for chest pain or palpitations Respiratory: negative for hemoptysis, wheezing, shortness of breath, or cough Abdominal: positive for abdominal pain, nausea, vomiting, diarrhea, and abd distention Dermatological: negative for rash Neurologic: negative for headache, dizziness, or syncope All other systems reviewed and are otherwise negative with the exception to those above and in  the HPI.  Physical Exam:  Blood pressure 124/82, pulse 84, temperature 98.6 F (37 C), temperature source Oral, resp. rate 17, height 5\' 5"  (1.651 m), weight 178 lb (80.74 kg), SpO2 98 %., Body mass index is 29.62 kg/(m^2). General: Well developed, well nourished, in no acute distress. Head: Normocephalic, atraumatic, eyes without discharge, sclera non-icteric, nares are without discharge. Bilateral auditory canals clear, TM's are without perforation, pearly grey  and translucent with reflective cone of light bilaterally. Oral cavity moist, posterior pharynx without exudate, erythema, peritonsillar abscess, or post nasal drip.  Neck: Supple. No thyromegaly. Full ROM. No lymphadenopathy. Lungs: Clear bilaterally to auscultation without wheezes, rales, or rhonchi. Breathing is unlabored. Heart: RRR with S1 S2. No murmurs, rubs, or gallops appreciated. Abdomen: hyperactive bowel sounds, epigastrium tender with deep palpations Msk:  Strength and tone normal for age. Extremities/Skin: Warm and dry. No clubbing or cyanosis. No edema. No rashes or suspicious lesions. Neuro: Alert and oriented X 3. Moves all extremities spontaneously. Gait is normal. CNII-XII grossly in tact. Psych:  Responds to questions appropriately with a normal affect.   Labs:  ASSESSMENT AND PLAN:  65 y.o. year old female with Nausea and vomiting, intractability of vomiting not specified, unspecified vomiting type - Plan: promethazine (PHENERGAN) 12.5 MG tablet, ranitidine (ZANTAC) 150 MG tablet  Diarrhea, unspecified type - Plan: promethazine (PHENERGAN) 12.5 MG tablet, ranitidine (ZANTAC) 150 MG tablet  Chronic congestion of paranasal sinus - Plan: CT Maxillofacial LTD WO CM  This chart was scribed in my presence and reviewed by me personally.     Signed, Elvina SidleKurt Meshell Abdulaziz, MD 03/12/2016 2:54 PM

## 2016-03-12 NOTE — Telephone Encounter (Signed)
The order for CT Maxillofacial Limited W/O Contrast needs to be changed to CT Maxillofacial Without Contrast before it can be scheduled.  Thank you.

## 2016-03-12 NOTE — Patient Instructions (Addendum)
  Clear liquids for the next 16 hours. You can try broth or Gatorade tomorrow morning with crackers. All fluid by tomorrow evening this will pass and you be able to eat most anything. Avoid dairy products for 2 days.  We will set up a CAT scan for your sinuses to see if antibiotics or other medicine can help clear this up.

## 2016-03-14 NOTE — Telephone Encounter (Signed)
Done

## 2016-03-20 ENCOUNTER — Ambulatory Visit
Admission: RE | Admit: 2016-03-20 | Discharge: 2016-03-20 | Disposition: A | Discharge status: Home / Self Care | Payer: BLUE CROSS/BLUE SHIELD | Source: Ambulatory Visit | Attending: Family Medicine | Admitting: Family Medicine

## 2016-03-20 DIAGNOSIS — J329 Chronic sinusitis, unspecified: Secondary | ICD-10-CM

## 2016-04-01 ENCOUNTER — Telehealth: Payer: Self-pay

## 2016-04-01 NOTE — Telephone Encounter (Signed)
Informed the FMLA was for headaches and sinus issue.

## 2016-04-01 NOTE — Telephone Encounter (Signed)
Waiting on call back from this patient on FMLA forms, I need to know exactly she needs them for.

## 2016-04-03 NOTE — Telephone Encounter (Signed)
Patient needs FMLA forms completed by Deliah BostonMichael Clark, I have tried to fill out the forms the best I could from the OV notes, if you could fill out the highlighted areas and return it the FMLA/Disability box at the 102 checkout desk within 5-7 business days. I will place in you box on 04-03-16. Thank you

## 2016-04-04 NOTE — Telephone Encounter (Signed)
Paperwork scanned and faxed to [employer] for the patient on 04/04/16

## 2016-04-15 ENCOUNTER — Ambulatory Visit (INDEPENDENT_AMBULATORY_CARE_PROVIDER_SITE_OTHER): Payer: BLUE CROSS/BLUE SHIELD | Admitting: Physician Assistant

## 2016-04-15 VITALS — BP 118/72 | HR 79 | Temp 98.4°F | Resp 18 | Ht 65.0 in | Wt 181.2 lb

## 2016-04-15 DIAGNOSIS — E039 Hypothyroidism, unspecified: Secondary | ICD-10-CM | POA: Diagnosis not present

## 2016-04-15 DIAGNOSIS — Z8679 Personal history of other diseases of the circulatory system: Secondary | ICD-10-CM

## 2016-04-15 DIAGNOSIS — J349 Unspecified disorder of nose and nasal sinuses: Secondary | ICD-10-CM | POA: Insufficient documentation

## 2016-04-15 DIAGNOSIS — Z1211 Encounter for screening for malignant neoplasm of colon: Secondary | ICD-10-CM

## 2016-04-15 NOTE — Patient Instructions (Addendum)
Take your blood pressure daily for the next seven days and please be sure that you have rested for 5 minutes before taking the measurement. Use the chart below to record. Please purchase a 20-30 dollar Omron Arm Cuff.  Please return this log to Deliah BostonMichael Clark PA-C.  Please continue taking you cholesterol medication and your thyroid medication for now.    Date Top Number Bottom Number Heart Rate  04/15/[phone] 79                                                    IF you received an x-ray today, you will receive an invoice from [facility]. Please contact [facility] at [phone] with questions or concerns regarding your invoice.   IF you received labwork today, you will receive an invoice from [facility]. Please contact [facility] at [phone] with questions or concerns regarding your invoice.   Our billing staff will not be able to assist you with questions regarding bills from these companies.  You will be contacted with the lab results as soon as they are available. The fastest way to get your results is to activate your My Chart account. Instructions are located on the last page of this paperwork. If you have not heard from us regarding the results in 2 weeks, please contact this office.

## 2016-04-15 NOTE — Progress Notes (Signed)
04/15/2016 5:59 PM   DOB: 20-Apr-1951 / MRN: [id number]  SUBJECTIVE:  Amanda McgregorBarbara B Kemp is a 65 y.o. female presenting for ;to discuss the results of her sinus CT. She would also like to talk about her blood pressure, and states that she has been off of her Lisinopril for 6 days not and feels well.  Denies HA, SOB, DOE, chest pain and vision changes. She stopped taking lisinopril because she started reading about the side effects online.   She has not been taking her statin medication.    With regards to her CT scan, this was largely normal aside from an ethmoid bulla.  She is not ready to go to ENT for further evaluation because she feels well at this time.  She has been taking Zyrtec with good relief of her nasal congestion.  She was previously treated with Amox-clav for chronic bacterial sinusitis and reports this largely resolved her symptoms.    She would like a referral for a colonoscopy, reporting that she "is due" and her children are after her to complete this screening.    She has No Known Allergies.   She  has a past medical history of Thyroid disease; Allergy; and Hypertension.    She  reports that she has never smoked. She does not have any smokeless tobacco history on file. She reports that she does not drink alcohol or use illicit drugs. She  reports that she currently engages in sexual activity. The patient  has no past surgical history on file.  Her family history includes Cancer in her brother, mother, sister, and sister; Hyperlipidemia in her mother; Hypertension in her mother.  Review of Systems  Constitutional: Negative for fever and chills.  Cardiovascular: Negative for chest pain.  Gastrointestinal: Negative for nausea.  Neurological: Negative for dizziness and headaches.    Problem list and medications reviewed and updated by myself where necessary, and exist elsewhere in the encounter.   OBJECTIVE:  BP 118/72 mmHg  Pulse 79  Temp(Src) 98.4 F (36.9 C)  (Oral)  Resp 18  Ht 5\' 5"  (1.651 m)  Wt 181 lb 3.2 oz (82.192 kg)  BMI 30.15 kg/m2  SpO2 99%  Physical Exam  Constitutional: She is oriented to person, place, and time. She appears well-nourished. No distress.  Eyes: EOM are normal. Pupils are equal, round, and reactive to light.  Cardiovascular: Normal rate.   Pulmonary/Chest: Effort normal.  Abdominal: She exhibits no distension.  Neurological: She is alert and oriented to person, place, and time. No cranial nerve deficit. Gait normal.  Skin: Skin is dry. She is not diaphoretic.  Psychiatric: She has a normal mood and affect.  Vitals reviewed.   No results found for this or any previous visit (from the past 72 hour(s)).  No results found.  ASSESSMENT AND PLAN  Amanda MccreedyBarbara was seen today for medication problem.  Diagnoses and all orders for this visit:  Special screening for malignant neoplasms, colon -     Ambulatory referral to Gastroenterology  Hypothyroidism, unspecified hypothyroidism type -     TSH  History of hypertension: Will stop the lisinopril for now as her BP is normal and she is asymptomatic.  She will complete a BP log and return it to me per her AVS.  Advised that she continue her statin medication and not miss doses.    Sinus problem: Ethmoid bulla.  Will hold on referral for now as she is doing fine.  Will keep this in mind when she present  with future sinus problems.       The patient was advised to call or return to clinic if she does not see an improvement in symptoms or to seek the care of the closest emergency department if she worsens with the above plan.   Amanda Kemp, MHS, PA-C [facility] 04/15/2016 5:59 PM

## 2016-04-16 LAB — TSH: TSH: 6.53 mIU/L — ABNORMAL HIGH

## 2016-04-30 DIAGNOSIS — Z0271 Encounter for disability determination: Secondary | ICD-10-CM

## 2016-05-14 ENCOUNTER — Other Ambulatory Visit: Payer: Self-pay | Admitting: Physician Assistant

## 2016-05-14 DIAGNOSIS — R7989 Other specified abnormal findings of blood chemistry: Secondary | ICD-10-CM

## 2016-05-14 MED ORDER — LEVOTHYROXINE SODIUM 100 MCG PO TABS: 100.0000 ug | ORAL_TABLET | Freq: Every day | ORAL | Status: DC

## 2016-06-03 ENCOUNTER — Ambulatory Visit (INDEPENDENT_AMBULATORY_CARE_PROVIDER_SITE_OTHER): Payer: BLUE CROSS/BLUE SHIELD | Admitting: Physician Assistant

## 2016-06-03 VITALS — BP 142/86 | HR 82 | Temp 98.0°F | Resp 16 | Ht 65.0 in | Wt 190.0 lb

## 2016-06-03 DIAGNOSIS — W57XXXA Bitten or stung by nonvenomous insect and other nonvenomous arthropods, initial encounter: Secondary | ICD-10-CM | POA: Diagnosis not present

## 2016-06-03 DIAGNOSIS — S30861A Insect bite (nonvenomous) of abdominal wall, initial encounter: Secondary | ICD-10-CM

## 2016-06-03 DIAGNOSIS — L03319 Cellulitis of trunk, unspecified: Secondary | ICD-10-CM

## 2016-06-03 MED ORDER — CEPHALEXIN 500 MG PO CAPS: 500.0000 mg | ORAL_CAPSULE | Freq: Four times a day (QID) | ORAL | Status: AC

## 2016-06-03 NOTE — Patient Instructions (Addendum)
Take keflex 4 times a day for 7 days. Apply heating pad. Benadryl or zyrtec could help with itching If the redness spreads, your pain worsens, you develop fever, or you start getting blisters, return to clinic    IF you received an x-ray today, you will receive an invoice from [facility]. Please contact [facility] at [phone] with questions or concerns regarding your invoice.   IF you received labwork today, you will receive an invoice from [facility]. Please contact [facility] at [phone] with questions or concerns regarding your invoice.   Our billing staff will not be able to assist you with questions regarding bills from these companies.  You will be contacted with the lab results as soon as they are available. The fastest way to get your results is to activate your My Chart account. Instructions are located on the last page of this paperwork. If you have not heard from us regarding the results in 2 weeks, please contact this office.

## 2016-06-03 NOTE — Progress Notes (Signed)
[facility] [address], [place] [id number] [phone]  Date:  06/03/2016   Name:  Amanda McgregorBarbara B Kemp   DOB:  1951/11/02   MRN:  [id number]  PCP:  No PCP Per Patient    Chief Complaint: Rash   History of Present Illness:  This is a 65 y.o. female with PMH HTN, env allergies who is presenting with a rash on her left flank since yesterday. Noticed it after waking yesterday morning. The area is very itching. Having some burning at the site as well. States she feels ok. Not having any fever or chills. Has not been outside much lately. Thinks she was bit by an insect during the night. No one else sleeps in her bed. Doesn't have any pets. She washed all of her sheets last night. Has never had shingles. Denies fever, chills. Has not taken anything for her symptoms.  Review of Systems:  Review of Systems See HPI  Patient Active Problem List   Diagnosis Date Noted  . Sinus problem 04/15/2016  . Essential hypertension 05/08/2015  . Environmental and seasonal allergies 05/08/2015    Prior to Admission medications   Medication Sig Start Date End Date Taking? Authorizing Provider  levothyroxine (SYNTHROID, LEVOTHROID) 100 MCG tablet Take 1 tablet (100 mcg total) by mouth daily. Please stop taking 88 mcg daily and take this instead. 05/14/16  Yes Ofilia NeasMichael L Clark, PA-C  lovastatin (MEVACOR) 20 MG tablet Take 1 tablet (20 mg total) by mouth at bedtime. 02/09/16  Yes Sherren MochaEva N Shaw, MD  ranitidine (ZANTAC) 150 MG tablet Take 1 tablet (150 mg total) by mouth 2 (two) times daily. 03/12/16  Yes Elvina SidleKurt Lauenstein, MD       Ofilia NeasMichael L Clark, PA-C       Elvina SidleKurt Lauenstein, MD    No Known Allergies  History reviewed. No pertinent past surgical history.  Social History  Substance Use Topics  . Smoking status: Never Smoker   . Smokeless tobacco: None  . Alcohol Use: No    Family History  Problem Relation Age of Onset  . Hyperlipidemia Mother   . Hypertension Mother   .  Cancer Mother   . Cancer Brother     prostate cancer  . Cancer Sister     kidney cancer  . Cancer Sister     lung cancer    Medication list has been reviewed and updated.  Physical Examination:  Physical Exam  Constitutional: She is oriented to person, place, and time. She appears well-developed and well-nourished. No distress.  HENT:  Head: Normocephalic and atraumatic.  Right Ear: Hearing normal.  Left Ear: Hearing normal.  Nose: Nose normal.  Eyes: Conjunctivae and lids are normal. Right eye exhibits no discharge. Left eye exhibits no discharge. No scleral icterus.  Pulmonary/Chest: Effort normal. No respiratory distress.  Musculoskeletal: Normal range of motion.  Neurological: She is alert and oriented to person, place, and time.  Skin: Skin is warm, dry and intact.  Left lateral thoracic with 4-5 cm area of erythema and induration. Central papule with hole in center, resembling insect bite. +TTP. +warmth Erythema demarcated with marker.  Psychiatric: She has a normal mood and affect. Her speech is normal and behavior is normal. Thought content normal.   BP 142/86 mmHg  Pulse 82  Temp(Src) 98 F (36.7 C)  Resp 16  Ht 5\' 5"  (1.651 m)  Wt 190 lb (86.183 kg)  BMI 31.62 kg/m2  SpO2 98%  Assessment and Plan:  1. Cellulitis of trunk, unspecified site 2. Insect bite Likely insect bite with subsequent cellulitis. Area is very tender. Demarcated with marker. Treat with keflex. Benadryl or zyrtec for itching. Rash is less likely shingles -- symptoms sound like symptoms but rash is not typical. Advised pt return if rash changes or symptoms worsen. - cephALEXin (KEFLEX) 500 MG capsule; Take 1 capsule (500 mg total) by mouth 4 (four) times daily.  Dispense: 28 capsule; Refill: 0    [place] [employer]ers. Dyke Brackett, MHS [facility]  06/03/2016

## 2016-06-18 ENCOUNTER — Encounter: Payer: Self-pay | Admitting: Physician Assistant

## 2016-07-14 ENCOUNTER — Ambulatory Visit (INDEPENDENT_AMBULATORY_CARE_PROVIDER_SITE_OTHER): Payer: BLUE CROSS/BLUE SHIELD | Admitting: Physician Assistant

## 2016-07-14 VITALS — BP 118/70 | HR 72 | Temp 97.5°F | Resp 17 | Ht 65.0 in | Wt 184.0 lb

## 2016-07-14 DIAGNOSIS — R3915 Urgency of urination: Secondary | ICD-10-CM

## 2016-07-14 DIAGNOSIS — M545 Low back pain, unspecified: Secondary | ICD-10-CM

## 2016-07-14 DIAGNOSIS — R0981 Nasal congestion: Secondary | ICD-10-CM

## 2016-07-14 DIAGNOSIS — R309 Painful micturition, unspecified: Secondary | ICD-10-CM

## 2016-07-14 DIAGNOSIS — J309 Allergic rhinitis, unspecified: Secondary | ICD-10-CM | POA: Diagnosis not present

## 2016-07-14 LAB — POCT URINALYSIS DIP (MANUAL ENTRY)
BILIRUBIN UA: NEGATIVE
Bilirubin, UA: NEGATIVE
Glucose, UA: NEGATIVE
Nitrite, UA: NEGATIVE
PH UA: 5
PROTEIN UA: NEGATIVE
SPEC GRAV UA: 1.01
UROBILINOGEN UA: 0.2

## 2016-07-14 LAB — POC MICROSCOPIC URINALYSIS (UMFC): MUCUS RE: ABSENT

## 2016-07-14 MED ORDER — FLUTICASONE PROPIONATE 50 MCG/ACT NA SUSP: 2.0000 | Freq: Every day | NASAL | 6 refills | Status: DC

## 2016-07-14 MED ORDER — CETIRIZINE HCL 10 MG PO CAPS: 10.0000 mg | ORAL_CAPSULE | Freq: Every day | ORAL | 6 refills | Status: DC

## 2016-07-14 MED ORDER — CYCLOBENZAPRINE HCL 5 MG PO TABS: 5.0000 mg | ORAL_TABLET | Freq: Three times a day (TID) | ORAL | 0 refills | Status: DC | PRN

## 2016-07-14 MED ORDER — MELOXICAM 15 MG PO TABS: 15.0000 mg | ORAL_TABLET | Freq: Every day | ORAL | 1 refills | Status: DC

## 2016-07-14 MED ORDER — TAMSULOSIN HCL 0.4 MG PO CAPS: 0.4000 mg | ORAL_CAPSULE | Freq: Every day | ORAL | 0 refills | Status: DC

## 2016-07-14 NOTE — Patient Instructions (Addendum)
  Make sure you drink plenty of water.  Take the flomax daily for discomfort. Use mobic and flexeril daily for inflammation. We will await urine culture results, if negative, and your pain is still persisting in three days we will order a CT.   Kidney Stones Kidney stones (urolithiasis) are solid masses that form inside your kidneys. The intense pain is caused by the stone moving through the kidney, ureter, bladder, and urethra (urinary tract). When the stone moves, the ureter starts to spasm around the stone. The stone is usually passed in your pee (urine).  HOME CARE  Drink enough fluids to keep your pee clear or pale yellow. This helps to get the stone out.  Take a 24-hour pee (urine) sample as told by your doctor. You may need to take another sample every 6-12 months.  Strain all pee through the provided strainer. Do not pee without peeing through the strainer, not even once. If you pee the stone out, catch it in the strainer. The stone may be as small as a grain of salt. Take this to your doctor. This will help your doctor figure out what you can do to try to prevent more kidney stones.  Only take medicine as told by your doctor.  Make changes to your daily diet as told by your doctor. You may be told to:  Limit how much salt you eat.  Eat 5 or more servings of fruits and vegetables each day.  Limit how much meat, poultry, fish, and eggs you eat.  Keep all follow-up visits as told by your doctor. This is important.  Get follow-up X-rays as told by your doctor. GET HELP IF: You have pain that gets worse even if you have been taking pain medicine. GET HELP RIGHT AWAY IF:   Your pain does not get better with medicine.  You have a fever or shaking chills.  Your pain increases and gets worse over 18 hours.  You have new belly (abdominal) pain.  You feel faint or pass out.  You are unable to pee.   This information is not intended to replace advice given to you by your  health care provider. Make sure you discuss any questions you have with your health care provider.   Document Released: 05/19/2008 Document Revised: 08/22/2015 Document Reviewed: 05/04/2013 Elsevier Interactive Patient Education 2016 [employer].   IF you received an x-ray today, you will receive an invoice from [facility]. Please contact [facility] at [phone] with questions or concerns regarding your invoice.   IF you received labwork today, you will receive an invoice from [employer]. Please contact [facility] at [phone] with questions or concerns regarding your invoice.   Our billing staff will not be able to assist you with questions regarding bills from these companies.  You will be contacted with the lab results as soon as they are available. The fastest way to get your results is to activate your My Chart account. Instructions are located on the last page of this paperwork. If you have not heard from [place] regarding the results in 2 weeks, please contact this office.

## 2016-07-14 NOTE — Progress Notes (Signed)
Amanda Kemp  MRN: [id number] DOB: Oct 07, 1951  Subjective:  Amanda Kemp is a 65 y.o. female seen in office today for a chief complaint of waxing and waning bilateral back pain x 5 days. Pt noticed what she thought was a kidney stone in the toilet after urinating four days ago. The pain in her back has improved after this incident but she is still having a lingering discomfort. Notes the pain is worsened when patient sneezes.   Associated urinary urgency. Denies dysuria, hematuria, bowel or urinary incontinence, direct trauma to lower back, and heavy lifting.  Has tried aleve with minimal relief.  Has a history of kidney stones. Cannot recall the last time she had one.    Review of Systems  Constitutional: Negative for chills, diaphoresis, fatigue and fever.  HENT: Positive for congestion, rhinorrhea, sinus pressure and sneezing.   Eyes: Positive for itching.  Gastrointestinal: Negative for constipation, diarrhea and vomiting.  Genitourinary: Negative for vaginal bleeding and vaginal discharge.  Neurological: Negative for dizziness, weakness and light-headedness.    Patient Active Problem List   Diagnosis Date Noted  . Sinus problem 04/15/2016  . Essential hypertension 05/08/2015  . Environmental and seasonal allergies 05/08/2015    Current Outpatient Prescriptions on File Prior to Visit  Medication Sig Dispense Refill  . fluticasone (FLONASE) 50 MCG/ACT nasal spray Place 2 sprays into both nostrils daily. 16 g 6  . levothyroxine (SYNTHROID, LEVOTHROID) 100 MCG tablet Take 1 tablet (100 mcg total) by mouth daily. Please stop taking 88 mcg daily and take this instead. 90 tablet 3  . lovastatin (MEVACOR) 20 MG tablet Take 1 tablet (20 mg total) by mouth at bedtime. 90 tablet 1  . ranitidine (ZANTAC) 150 MG tablet Take 1 tablet (150 mg total) by mouth 2 (two) times daily. 30 tablet 0  . promethazine (PHENERGAN) 12.5 MG tablet Take 1 tablet (12.5 mg total) by mouth  every 8 (eight) hours as needed for nausea or vomiting. (Patient not taking: Reported on 06/03/2016) 20 tablet 0   No current facility-administered medications on file prior to visit.     No Known Allergies  Objective:  BP 118/70 (BP Location: Right Arm, Patient Position: Sitting, Cuff Size: Normal)   Pulse 72   Temp 97.5 F (36.4 C) (Oral)   Resp 17   Ht  (1.651 m)   Wt 184 lb (83.5 kg)   SpO2 97%   BMI 30.62 kg/m   Physical Exam  Constitutional: She is oriented to person, place, and time and well-developed, well-nourished, and in no distress.  HENT:  Head: Normocephalic and atraumatic.  Right Ear: Tympanic membrane, external ear and ear canal normal.  Left Ear: Tympanic membrane, external ear and ear canal normal.  Nose: Mucosal edema and rhinorrhea present.  Mouth/Throat: Posterior oropharyngeal erythema present.  Eyes: Conjunctivae are normal.  Neck: Normal range of motion.  Cardiovascular: Regular rhythm.   Pulmonary/Chest: Effort normal.  Abdominal: Soft. Bowel sounds are normal. There is no tenderness. There is CVA tenderness (billateral).  Musculoskeletal:       Lumbar back: She exhibits tenderness (along palpation of latissimus dorsi bilaterally.  ). She exhibits normal range of motion, no bony tenderness and no pain.  Neurological: She is alert and oriented to person, place, and time. Gait normal.  Skin: Skin is warm and dry.  Psychiatric: Affect normal.  Vitals reviewed.  Results for orders placed or performed in visit on 07/14/16 (from the past 24 hour(s))  POCT urinalysis  dipstick     Status: Abnormal   Collection Time: 07/14/16 10:09 AM  Result Value Ref Range   Color, UA yellow yellow   Clarity, UA clear clear   Glucose, UA negative negative   Bilirubin, UA negative negative   Ketones, POC UA negative negative   Spec Grav, UA 1.010    Blood, UA trace-intact (A) negative   pH, UA 5.0    Protein Ur, POC negative negative   Urobilinogen, UA 0.2     Nitrite, UA Negative Negative   Leukocytes, UA Trace (A) Negative    Assessment and Plan :   1. Pain with urination -Increase hydration with water. - POCT urinalysis dipstick - POCT Microscopic Urinalysis ([facility])  2. Bilateral low back pain without sciatica -Could be due to residual pain after passing kidney stone. - meloxicam (MOBIC) 15 MG tablet; Take 1 tablet (15 mg total) by mouth daily.  Dispense: 30 tablet; Refill: 1 - cyclobenzaprine (FLEXERIL) 5 MG tablet; Take 1 tablet (5 mg total) by mouth 3 (three) times daily as needed for muscle spasms.  Dispense: 60 tablet; Refill: 0 -Pt instructed to follow up on Wednesday (07/16/16), will plan to order CT at this time if no improvement with current medication regimen and if urine culture is negative.   3. Urinary urgency - Await results of Urine culture - tamsulosin (FLOMAX) 0.4 MG CAPS capsule; Take 1 capsule (0.4 mg total) by mouth daily.  Dispense: 30 capsule; Refill: 0  4. Allergic rhinitis, unspecified allergic rhinitis type - Cetirizine HCl 10 MG CAPS; Take 1 capsule (10 mg total) by mouth daily.  Dispense: 30 capsule; Refill: 6 - fluticasone (FLONASE) 50 MCG/ACT nasal spray; Place 2 sprays into both nostrils daily.  Dispense: 16 g; Refill: 6  5. Nasal congestion - fluticasone (FLONASE) 50 MCG/ACT nasal spray; Place 2 sprays into both nostrils daily.  Dispense: 16 g; Refill: 6  Benjiman Core PA-C  [facility] 07/14/2016 10:24 AM

## 2016-07-15 LAB — URINE CULTURE

## 2016-08-12 ENCOUNTER — Ambulatory Visit (INDEPENDENT_AMBULATORY_CARE_PROVIDER_SITE_OTHER): Payer: BLUE CROSS/BLUE SHIELD | Admitting: Physician Assistant

## 2016-08-12 VITALS — BP 134/80 | HR 87 | Temp 98.1°F | Resp 17 | Ht 65.0 in | Wt 183.0 lb

## 2016-08-12 DIAGNOSIS — R7989 Other specified abnormal findings of blood chemistry: Secondary | ICD-10-CM

## 2016-08-12 DIAGNOSIS — R946 Abnormal results of thyroid function studies: Secondary | ICD-10-CM | POA: Diagnosis not present

## 2016-08-12 DIAGNOSIS — R319 Hematuria, unspecified: Secondary | ICD-10-CM | POA: Diagnosis not present

## 2016-08-12 DIAGNOSIS — R1032 Left lower quadrant pain: Secondary | ICD-10-CM | POA: Diagnosis not present

## 2016-08-12 LAB — POCT URINALYSIS DIP (MANUAL ENTRY)
BILIRUBIN UA: NEGATIVE
Bilirubin, UA: NEGATIVE
Glucose, UA: NEGATIVE
Leukocytes, UA: NEGATIVE
Nitrite, UA: NEGATIVE
PH UA: 5
PROTEIN UA: NEGATIVE
SPEC GRAV UA: 1.025
Urobilinogen, UA: 0.2

## 2016-08-12 LAB — POC MICROSCOPIC URINALYSIS (UMFC): Mucus: ABSENT

## 2016-08-12 NOTE — Progress Notes (Signed)
08/12/2016 5:29 PM   DOB: Feb 29, 1951 / MRN: [id number]  SUBJECTIVE:  Amanda Kemp is a 65 y.o. female presenting for bloating and follow up of flank pain.  Saw PA Barnett AbuWiseman about 1 month ago and given hematuria and symptoms was started on meloxicam, flexeril and flomax.  She took these and is feeling better.    Reports that over the last week or so has developed abdominal bloating.  Relates some difficulty with defecation, saying that it is taking longer to defecate and is passing less stool.  Has some mild left lower abdominal pain.  No fever, chills, loss of appetite.   She has No Known Allergies.   She  has a past medical history of Allergy; Hypertension; and Thyroid disease.    She  reports that she has never smoked. She does not have any smokeless tobacco history on file. She reports that she does not drink alcohol or use drugs. She  reports that she currently engages in sexual activity. The patient  has no past surgical history on file.  Her family history includes Cancer in her brother, mother, sister, and sister; Hyperlipidemia in her mother; Hypertension in her mother.  Review of Systems  Constitutional: Negative for fever.  Cardiovascular: Negative for chest pain.  Gastrointestinal: Negative for nausea.  Musculoskeletal: Negative for myalgias.  Skin: Negative for rash.  Neurological: Negative for dizziness.  Psychiatric/Behavioral: Negative for depression.    The problem list and medications were reviewed and updated by myself where necessary and exist elsewhere in the encounter.   OBJECTIVE:  BP 134/80 (BP Location: Right Arm, Patient Position: Sitting, Cuff Size: Normal)   Pulse 87   Temp 98.1 F (36.7 C) (Oral)   Resp 17   Ht 5\' 5"  (1.651 m)   Wt 183 lb (83 kg)   SpO2 99%   BMI 30.45 kg/m   Physical Exam  Constitutional: She is oriented to person, place, and time.  Cardiovascular: Normal rate and regular rhythm.   Pulmonary/Chest: Effort normal and  breath sounds normal.  Abdominal: Soft. Bowel sounds are normal. She exhibits no distension and no mass. There is no tenderness. There is no rebound and no guarding.  Neurological: She is alert and oriented to person, place, and time.     Lab Results  Component Value Date   CREATININE 0.83 11/27/2015     Results for orders placed or performed in visit on 08/12/16 (from the past 72 hour(s))  POCT urinalysis dipstick     Status: Abnormal   Collection Time: 08/12/16  5:21 PM  Result Value Ref Range   Color, UA yellow yellow   Clarity, UA cloudy (A) clear   Glucose, UA negative negative   Bilirubin, UA negative negative   Ketones, POC UA negative negative   Spec Grav, UA 1.025    Blood, UA moderate (A) negative   pH, UA 5.0    Protein Ur, POC negative negative   Urobilinogen, UA 0.2    Nitrite, UA Negative Negative   Leukocytes, UA Negative Negative    No results found.  Wt Readings from Last 3 Encounters:  08/12/16 183 lb (83 kg)  07/14/16 184 lb (83.5 kg)  06/03/16 190 lb (86.2 kg)     ASSESSMENT AND PLAN  [name] was seen today for follow-up.  Diagnoses and all orders for this visit:  Elevated TSH -     TSH  Abdominal pain, left lower quadrant: This is likely constipation per HPI and exam.   -  COMPLETE METABOLIC PANEL WITH GFR -     POCT urinalysis dipstick -     CBC with Differential/Platelet  Hematuria: Her flank pain is improving.  Advised agianst a CT today and will plan to image if her side pain is not resolved in the next 7 days.   -     POCT Microscopic Urinalysis ([facility])    The patient is advised to call or return to clinic if she does not see an improvement in symptoms, or to seek the care of the closest emergency department if she worsens with the above plan.   [name], MHS, PA-C [facility] 08/12/2016 5:29 PM

## 2016-08-12 NOTE — Patient Instructions (Addendum)
  For constipation   Make sure you are drinking enough water daily. Make sure you are getting enough fiber in your diet - this will make you regular - you can eat high fiber foods or use metamucil as a supplement - it is really important to drink enough water when using fiber supplements.  If your stools are hard or are formed balls or you have to strain a stool softener will help - use colace 2-3 capsule a day  For gentle treatment of constipation Use Miralax 1-2 capfuls a day until your stools are soft and regular and then decrease the usage - you can use this daily  For more aggressive treatment of constipation Use 4 capfuls of Colace and 6 doses of Miralax and drink it in 2 hours - this should result in several watery stools - if it does not repeat the next day and then go to daily miralax for a week to make sure your bowels are clean and retrained to work properly  For the most aggressive treatment of constipation Use 14 capfuls of Miralax in 1 gallon of fluid (gatoraid or water work well or a combination of the two) and drink over 12h - it is ok to eat during this time and then use Miralax 1 capful daily for about 2 weeks to prevent the constipation from returning    IF you received an x-ray today, you will receive an invoice from [facility]. Please contact [facility] at [phone] with questions or concerns regarding your invoice.   IF you received labwork today, you will receive an invoice from [facility]. Please contact [facility] at [phone] with questions or concerns regarding your invoice.   Our billing staff will not be able to assist you with questions regarding bills from these companies.  You will be contacted with the lab results as soon as they are available. The fastest way to get your results is to activate your My Chart account. Instructions are located on the last page of this paperwork. If you have not heard from us  regarding the results in 2 weeks, please contact this office.     

## 2016-08-13 LAB — CBC WITH DIFFERENTIAL/PLATELET
BASOS ABS: 83 {cells}/uL (ref 0–200)
BASOS PCT: 1 %
EOS ABS: 415 {cells}/uL (ref 15–500)
Eosinophils Relative: 5 %
HEMATOCRIT: 39.3 % (ref 35.0–45.0)
Hemoglobin: 13.1 g/dL (ref 11.7–15.5)
LYMPHS PCT: 38 %
Lymphs Abs: 3154 cells/uL (ref 850–3900)
MCH: 26.1 pg — AB (ref 27.0–33.0)
MCHC: 33.3 g/dL (ref 32.0–36.0)
MCV: 78.4 fL — AB (ref 80.0–100.0)
MONO ABS: 664 {cells}/uL (ref 200–950)
MONOS PCT: 8 %
MPV: 10.1 fL (ref 7.5–12.5)
NEUTROS PCT: 48 %
Neutro Abs: 3984 cells/uL (ref 1500–7800)
PLATELETS: 360 10*3/uL (ref 140–400)
RBC: 5.01 MIL/uL (ref 3.80–5.10)
RDW: 14.7 % (ref 11.0–15.0)
WBC: 8.3 10*3/uL (ref 3.8–10.8)

## 2016-08-13 LAB — COMPLETE METABOLIC PANEL WITH GFR
ALT: 17 U/L (ref 6–29)
AST: 18 U/L (ref 10–35)
Albumin: 4.4 g/dL (ref 3.6–5.1)
Alkaline Phosphatase: 69 U/L (ref 33–130)
BILIRUBIN TOTAL: 0.2 mg/dL (ref 0.2–1.2)
BUN: 16 mg/dL (ref 7–25)
CHLORIDE: 103 mmol/L (ref 98–110)
CO2: 24 mmol/L (ref 20–31)
CREATININE: 0.85 mg/dL (ref 0.50–0.99)
Calcium: 9.7 mg/dL (ref 8.6–10.4)
GFR, Est African American: 84 mL/min (ref >=60)
GFR, Est Non African American: 73 mL/min (ref >=60)
GLUCOSE: 83 mg/dL (ref 65–99)
Potassium: 4.2 mmol/L (ref 3.5–5.3)
Sodium: 141 mmol/L (ref 135–146)
TOTAL PROTEIN: 7.7 g/dL (ref 6.1–8.1)

## 2016-08-13 LAB — TSH: TSH: 8.36 m[IU]/L — AB

## 2016-08-26 ENCOUNTER — Other Ambulatory Visit: Payer: Self-pay | Admitting: *Deleted

## 2016-08-26 DIAGNOSIS — R7989 Other specified abnormal findings of blood chemistry: Secondary | ICD-10-CM

## 2016-08-27 ENCOUNTER — Telehealth: Payer: Self-pay | Admitting: *Deleted

## 2016-08-27 NOTE — Telephone Encounter (Signed)
What prescription dose Thyroid medication is she taking.  Needs to come in for lab/only draw future orders are in computer.         See lab results

## 2016-08-27 NOTE — Telephone Encounter (Signed)
Patient wants to know why she needs more blood work. Please advise! [phone]

## 2016-08-29 ENCOUNTER — Encounter: Payer: Self-pay | Admitting: *Deleted

## 2016-08-29 NOTE — Telephone Encounter (Signed)
Spoke with pt regarding need for lab work. She verbalized understanding. Will come in the next few days.

## 2016-10-01 ENCOUNTER — Ambulatory Visit (INDEPENDENT_AMBULATORY_CARE_PROVIDER_SITE_OTHER): Payer: BLUE CROSS/BLUE SHIELD | Admitting: Physician Assistant

## 2016-10-01 VITALS — BP 124/82 | HR 69 | Temp 97.9°F | Resp 16 | Ht 65.0 in | Wt 184.0 lb

## 2016-10-01 DIAGNOSIS — E039 Hypothyroidism, unspecified: Secondary | ICD-10-CM | POA: Diagnosis not present

## 2016-10-01 DIAGNOSIS — J309 Allergic rhinitis, unspecified: Secondary | ICD-10-CM | POA: Diagnosis not present

## 2016-10-01 MED ORDER — GUAIFENESIN ER 1200 MG PO TB12: 1.0000 | ORAL_TABLET | Freq: Two times a day (BID) | ORAL | 1 refills | Status: DC | PRN

## 2016-10-01 NOTE — Patient Instructions (Addendum)
I would like you to return in 6 weeks for follow up blood work.  You will come in for a labs only.  You must take the medicine every day, so the blood work will be right, and we know whether we need to take your medicine.   Allergic Rhinitis Allergic rhinitis is when the mucous membranes in the nose respond to allergens. Allergens are particles in the air that cause your body to have an allergic reaction. This causes you to release allergic antibodies. Through a chain of events, these eventually cause you to release histamine into the blood stream. Although meant to protect the body, it is this release of histamine that causes your discomfort, such as frequent sneezing, congestion, and an itchy, runny nose.  CAUSES Seasonal allergic rhinitis (hay fever) is caused by pollen allergens that may come from grasses, trees, and weeds. Year-round allergic rhinitis (perennial allergic rhinitis) is caused by allergens such as house dust mites, pet dander, and mold spores. SYMPTOMS  Nasal stuffiness (congestion).  Itchy, runny nose with sneezing and tearing of the eyes. DIAGNOSIS Your health care provider can help you determine the allergen or allergens that trigger your symptoms. If you and your health care provider are unable to determine the allergen, skin or blood testing may be used. Your health care provider will diagnose your condition after taking your health history and performing a physical exam. Your health care provider may assess you for other related conditions, such as asthma, pink eye, or an ear infection. TREATMENT Allergic rhinitis does not have a cure, but it can be controlled by:  Medicines that block allergy symptoms. These may include allergy shots, nasal sprays, and oral antihistamines.  Avoiding the allergen. Hay fever may often be treated with antihistamines in pill or nasal spray forms. Antihistamines block the effects of histamine. There are over-the-counter medicines that may help  with nasal congestion and swelling around the eyes. Check with your health care provider before taking or giving this medicine. If avoiding the allergen or the medicine prescribed do not work, there are many new medicines your health care provider can prescribe. Stronger medicine may be used if initial measures are ineffective. Desensitizing injections can be used if medicine and avoidance does not work. Desensitization is when a patient is given ongoing shots until the body becomes less sensitive to the allergen. Make sure you follow up with your health care provider if problems continue. HOME CARE INSTRUCTIONS It is not possible to completely avoid allergens, but you can reduce your symptoms by taking steps to limit your exposure to them. It helps to know exactly what you are allergic to so that you can avoid your specific triggers. SEEK MEDICAL CARE IF:  You have a fever.  You develop a cough that does not stop easily (persistent).  You have shortness of breath.  You start wheezing.  Symptoms interfere with normal daily activities.   This information is not intended to replace advice given to you by your health care provider. Make sure you discuss any questions you have with your health care provider.   Document Released: 08/26/2001 Document Revised: 12/22/2014 Document Reviewed: 08/08/2013 Elsevier Interactive Patient Education 2016 [employer].     IF you received an x-ray today, you will receive an invoice from [facility]. Please contact [facility] at [phone] with questions or concerns regarding your invoice.   IF you received labwork today, you will receive an invoice from [employer]. Please contact [facility] at [phone]  with questions or concerns regarding your invoice.   Our billing staff will not be able to assist you with questions regarding bills from these companies.  You will be contacted with the lab results as soon  as they are available. The fastest way to get your results is to activate your My Chart account. Instructions are located on the last page of this paperwork. If you have not heard from us regarding the results in 2 weeks, please contact this office.

## 2016-10-01 NOTE — Progress Notes (Signed)
[facility] [address], [place] [id number] [phone]  By signing my name below, I, Amanda Kemp, attest that this documentation has been prepared under the direction and in the presence of [place]phanie English, PA-C. Electronically Signed: Arvilla MarketMesha Kemp, Medical Scribe. 10/01/16. 11:03 AM.  Date:  10/01/2016   Name:  Amanda McgregorBarbara B Kemp   DOB:  01/17/1951   MRN:  [id number]  PCP:  No PCP Per Patient   Chief Complaint  Patient presents with   Follow-up    BLOOD WORK   Nasal Congestion    LIMBS HURTING    History of Present Illness:  Amanda Kemp is a 65 y.o. female patient with a PMHx of HTN and hyperthyroidism who [facility]. Pt was last seen in Aug for her thyroid; TSH was 8.36. She was contacted to return for a T4 and T3; however, she did not follow-up since we wasn't able to reach her. We needed an exact dosage of levothyroxine she was on.  Hyperthyroidism: Pt takes levothyroxine 100 mcg, but she hasn't been compliant. Pt took 1 dose this week, but she occasionally misses 2 days out of the week. Pt reports light nausea, "little" constipation, and fatigue. Pt complains of arthralgias and myalgias in her extremities onset 4 days ago. Pt mentions it hurts her to raise her arms and to bend her knees. Pt does not exercise. Pt denies diarrhea, change in her skin, bloody stool, melena, fever, cough, joint swelling, and warmth in joints.  Nasal Congestion: Pt has sinus pain, nasal congestion, rhinorrhea, postnasal drip, sneezing, watery eyes, and clogged ears. Pt mentions some of the sputum from her nose is bloody. Pt takes Cetirizine every day without relief to her symptoms. Pt has not been drinking a lot of water. Pt does not takes flonase.  Patient Active Problem List   Diagnosis Date Noted   Sinus problem 04/15/2016   Essential hypertension 05/08/2015   Environmental and seasonal allergies 05/08/2015    Past Medical  History:  Diagnosis Date   Allergy    Hypertension    Thyroid disease     History reviewed. No pertinent surgical history.  Social History  Substance Use Topics   Smoking status: Never Smoker   Smokeless tobacco: Never Used   Alcohol use No    Family History  Problem Relation Age of Onset   Hyperlipidemia Mother    Hypertension Mother    Cancer Mother    Cancer Brother     prostate cancer   Cancer Sister     kidney cancer   Cancer Sister     lung cancer    No Known Allergies  Medication list has been reviewed and updated.  Current Outpatient Prescriptions on File Prior to Visit  Medication Sig Dispense Refill   Cetirizine HCl 10 MG CAPS Take 1 capsule (10 mg total) by mouth daily. 30 capsule 6   fluticasone (FLONASE) 50 MCG/ACT nasal spray Place 2 sprays into both nostrils daily. 16 g 6   levothyroxine (SYNTHROID, LEVOTHROID) 100 MCG tablet Take 1 tablet (100 mcg total) by mouth daily. Please stop taking 88 mcg daily and take this instead. 90 tablet 3   lovastatin (MEVACOR) 20 MG tablet Take 1 tablet (20 mg total) by mouth at bedtime. 90 tablet 1   ranitidine (ZANTAC) 150 MG tablet Take 1 tablet (150 mg total) by mouth 2 (two) times daily. 30 tablet 0   No current facility-administered medications on file  prior to visit.     Review of Systems  Constitutional: Positive for malaise/fatigue.  HENT: Positive for congestion.   Eyes: Positive for discharge.  Respiratory: Positive for sputum production. Negative for cough.   Gastrointestinal: Positive for constipation and nausea. Negative for blood in stool, diarrhea and melena.  Musculoskeletal: Positive for joint pain and myalgias.    Physical Examination: BP 124/82 (BP Location: Right Arm, Patient Position: Sitting, Cuff Size: Small)    Pulse 69    Temp 97.9 F (36.6 C) (Oral)    Resp 16    Ht 5\' 5"  (1.651 m)    Wt 184 lb (83.5 kg)    SpO2 98%    BMI 30.62 kg/m  Ideal Body Weight:  @FLOWAMB ([id number])@  Physical Exam  Constitutional: She is oriented to person, place, and time. She appears well-developed and well-nourished. No distress.  HENT:  Head: Normocephalic and atraumatic.  Right Ear: External ear normal.  Left Ear: External ear normal.  Boggy, pale, edematous turbinates  Eyes: Conjunctivae and EOM are normal. Pupils are equal, round, and reactive to light.  Neck: No thyromegaly present.  Cardiovascular: Normal rate, regular rhythm and normal heart sounds.  Exam reveals no gallop and no friction rub.   No murmur heard. Pulmonary/Chest: Effort normal and breath sounds normal. No respiratory distress. She has no wheezes. She has no rales.  Lymphadenopathy:    She has no cervical adenopathy.  Neurological: She is alert and oriented to person, place, and time.  Skin: She is not diaphoretic.  Psychiatric: She has a normal mood and affect. Her behavior is normal.    Assessment and Plan: Amanda Kemp is a 65 y.o. female who is here today for sinus congestion and follow up of thyroid.  Unfortunately patient has not taken her levothyroxine on consistent basis so assessing therapeutic levels is unable to occur today. I've advised her to start taking her levothyroxine daily. I've placed it in her found her wearing every morning for her to take. She will return in 6-8 weeks for labs only of her thyroid. I've also place this in her phone as well. This appears to be bowel allergies. Advised to continue the Zyrtec. She will also at the [facility] and Mucinex. If her symptoms are not improving we may consider using an antibiotic or boosting the allergy medicine with Singulair.  Allergic rhinitis, unspecified chronicity, unspecified seasonality, unspecified trigger - Plan: Guaifenesin (MUCINEX MAXIMUM STRENGTH) 1200 MG TB12  Hypothyroidism, unspecified type - Plan: Thyroid Panel With TSH  Trena Platt, PA-C [facility] 10/18/20176:58 PM

## 2017-01-26 ENCOUNTER — Ambulatory Visit (INDEPENDENT_AMBULATORY_CARE_PROVIDER_SITE_OTHER): Payer: BLUE CROSS/BLUE SHIELD | Admitting: Physician Assistant

## 2017-01-26 ENCOUNTER — Ambulatory Visit (INDEPENDENT_AMBULATORY_CARE_PROVIDER_SITE_OTHER): Payer: BLUE CROSS/BLUE SHIELD

## 2017-01-26 VITALS — BP 124/66 | HR 73 | Temp 98.0°F | Resp 16

## 2017-01-26 DIAGNOSIS — M25531 Pain in right wrist: Secondary | ICD-10-CM | POA: Diagnosis not present

## 2017-01-26 DIAGNOSIS — E039 Hypothyroidism, unspecified: Secondary | ICD-10-CM | POA: Diagnosis not present

## 2017-01-26 DIAGNOSIS — M25532 Pain in left wrist: Secondary | ICD-10-CM

## 2017-01-26 DIAGNOSIS — G8929 Other chronic pain: Secondary | ICD-10-CM | POA: Diagnosis not present

## 2017-01-26 DIAGNOSIS — M545 Low back pain: Secondary | ICD-10-CM

## 2017-01-26 MED ORDER — MELOXICAM 15 MG PO TABS: 7.5000 mg | ORAL_TABLET | Freq: Every day | ORAL | 0 refills | Status: DC

## 2017-01-26 NOTE — Patient Instructions (Signed)
     IF you received an x-ray today, you will receive an invoice from [facility]. Please contact [facility] at [phone] with questions or concerns regarding your invoice.   IF you received labwork today, you will receive an invoice from [facility]. Please contact [facility] at [phone] with questions or concerns regarding your invoice.   Our billing staff will not be able to assist you with questions regarding bills from these companies.  You will be contacted with the lab results as soon as they are available. The fastest way to get your results is to activate your My Chart account. Instructions are located on the last page of this paperwork. If you have not heard from us regarding the results in 2 weeks, please contact this office.     

## 2017-01-26 NOTE — Progress Notes (Signed)
01/27/2017 9:16 AM   DOB: 03-02-1951 / MRN: [id number]  SUBJECTIVE:  Amanda Kemp is a pleasant  66 y.o. female presenting for back pain, bilateral wrist and hand pain left worse than right, and thyroid recheck.  She is largely lost to follow up for her hypothyroidism which is not new for her.   The back pain is not new, and has been worsening over the last 3-4 months.  No rads exist in the system.  The is lumbar and there is no radicular pattern or paresthesia.  She denies weakness.  She has tried 800 mg ibuprofen and aleve, both of which were helpful.    The wrist pain is generalized.  Working as Probation officer makes the pain worse, and rest makes it somewhat better. She denies swelling, paresthesia, change in ROM and strength.     She has No Known Allergies.   She  has a past medical history of Allergy; Hypertension; and Thyroid disease.    She  reports that she has never smoked. She has never used smokeless tobacco. She reports that she does not drink alcohol or use drugs. She  reports that she currently engages in sexual activity. The patient  has no past surgical history on file.  Her family history includes Cancer in her brother, mother, sister, and sister; Hyperlipidemia in her mother; Hypertension in her mother.  Review of Systems  Constitutional: Negative for chills and fever.  Cardiovascular: Negative for chest pain.  Gastrointestinal: Negative for constipation.  Genitourinary: Negative for flank pain, frequency and urgency.  Musculoskeletal: Positive for back pain and joint pain. Negative for falls, myalgias and neck pain.  Skin: Negative for rash.  Neurological: Negative for dizziness.    The problem list and medications were reviewed and updated by myself where necessary and exist elsewhere in the encounter.   OBJECTIVE:  BP 124/66 (Cuff Size: Normal)   Pulse 73   Temp 98 F (36.7 C) (Oral)   Resp 16   SpO2 98%   Physical Exam  Cardiovascular: Normal rate,  regular rhythm and normal heart sounds.   Pulmonary/Chest: Effort normal and breath sounds normal.  Musculoskeletal: Normal range of motion.       Right wrist: She exhibits tenderness. She exhibits normal range of motion, no swelling, no effusion, no crepitus and no laceration.       Left wrist: She exhibits tenderness. She exhibits normal range of motion, no swelling, no effusion, no crepitus, no deformity and no laceration.       Lumbar back: She exhibits tenderness (paraspinal). She exhibits normal range of motion, no bony tenderness, no swelling, no edema and no deformity.  Negative phalens and tinnels bilaterally.  Aside from pain and generalized tenderness her exam is normal.   Skin: Skin is warm and dry.    Lab Results  Component Value Date   TSH 8.36 (H) 08/12/2016   Lab Results  Component Value Date   CREATININE 0.85 08/12/2016   No results found for this or any previous visit (from the past 72 hour(s)).  Dg Lumbar Spine 2-3 Views  Result Date: 01/26/2017 CLINICAL DATA:  Low back pain for 2-3 months EXAM: LUMBAR SPINE - 2-3 VIEW COMPARISON:  None. FINDINGS: There are 5 nonrib bearing lumbar-type vertebral bodies. The vertebral body heights are maintained. The alignment is anatomic. There is no static listhesis. There is no spondylolysis. There is no acute fracture. Degenerative disc disease with disc height loss at L3-4 with bilateral facet arthropathy. The SI  joints are unremarkable. IMPRESSION: Degenerative disc disease with disc height loss at L3-4 with bilateral facet arthropathy. Electronically Signed   By: Elige KoHetal  Patel   On: 01/26/2017 11:40    ASSESSMENT AND PLAN:  Amanda Kemp was seen today for back pain.  Diagnoses and all orders for this visit:  Chronic bilateral low back pain without sciatica -     DG Lumbar Spine 2-3 Views; Future -     meloxicam (MOBIC) 15 MG tablet; Take 0.5-1 tablets (7.5-15 mg total) by mouth daily.  Pain in both wrists -     meloxicam (MOBIC)  15 MG tablet; Take 0.5-1 tablets (7.5-15 mg total) by mouth daily.  Hypothyroidism, unspecified type: She has been off of her medication yet again.  I can not assess her thyroid adequately if she is not taking her medication.  Refilling at 100 mcg today.  She will not miss doses.  RTC in one month and the clinical staff escorted her to clerical to make that appointment.  -     levothyroxine (SYNTHROID, LEVOTHROID) 100 MCG tablet; Take 1 tablet (100 mcg total) by mouth daily. Please stop taking 88 mcg daily and take this instead.    The patient is advised to call or return to clinic if she does not see an improvement in symptoms, or to seek the care of the closest emergency department if she worsens with the above plan.   Deliah BostonMichael Clark, MHS, PA-C [facility] 01/27/2017 9:16 AM

## 2017-01-27 MED ORDER — LEVOTHYROXINE SODIUM 100 MCG PO TABS: 100.0000 ug | ORAL_TABLET | Freq: Every day | ORAL | 3 refills | Status: DC

## 2017-02-16 ENCOUNTER — Encounter: Payer: Self-pay | Admitting: Physician Assistant

## 2017-02-16 ENCOUNTER — Ambulatory Visit (INDEPENDENT_AMBULATORY_CARE_PROVIDER_SITE_OTHER): Payer: BLUE CROSS/BLUE SHIELD

## 2017-02-16 ENCOUNTER — Ambulatory Visit (INDEPENDENT_AMBULATORY_CARE_PROVIDER_SITE_OTHER): Payer: BLUE CROSS/BLUE SHIELD | Admitting: Physician Assistant

## 2017-02-16 VITALS — BP 134/76 | HR 69 | Temp 98.2°F | Resp 16 | Ht 64.0 in | Wt 184.4 lb

## 2017-02-16 DIAGNOSIS — E039 Hypothyroidism, unspecified: Secondary | ICD-10-CM

## 2017-02-16 DIAGNOSIS — Z791 Long term (current) use of non-steroidal anti-inflammatories (NSAID): Secondary | ICD-10-CM | POA: Diagnosis not present

## 2017-02-16 DIAGNOSIS — M25532 Pain in left wrist: Secondary | ICD-10-CM | POA: Diagnosis not present

## 2017-02-16 DIAGNOSIS — M545 Low back pain: Secondary | ICD-10-CM | POA: Diagnosis not present

## 2017-02-16 DIAGNOSIS — M25561 Pain in right knee: Secondary | ICD-10-CM | POA: Diagnosis not present

## 2017-02-16 DIAGNOSIS — Z5181 Encounter for therapeutic drug level monitoring: Secondary | ICD-10-CM

## 2017-02-16 DIAGNOSIS — G8929 Other chronic pain: Secondary | ICD-10-CM

## 2017-02-16 DIAGNOSIS — M25531 Pain in right wrist: Secondary | ICD-10-CM | POA: Diagnosis not present

## 2017-02-16 MED ORDER — MELOXICAM 15 MG PO TABS: 7.5000 mg | ORAL_TABLET | Freq: Every day | ORAL | 3 refills | Status: DC

## 2017-02-16 NOTE — Progress Notes (Signed)
02/16/2017 12:15 PM   DOB: June 24, 1951 / MRN: [id number]  SUBJECTIVE:  Amanda Kemp is a 66 y.o. female presenting for for recheck of MSK pain. I saw her about 1 month ago for bilateral wrist pain and back pain that she attributes to working.  She was given bilateral wrist braces and Meloxicam.    Today she tells me she continues to have pain but is feeling somewhat better with meloxicam  She again tells me she needs to retire from her work as all of her symptoms seem to arise from that. She plans to retire as soon as possible. She was unable to wear the wrist braces while at work because she could not perform her duties while wearing them.  She did not have carpal tunnel by my last assessment.    She complains of new right knee pain today that started after she got out of her car and felt a pop.  Says since then the knee has felt swollen and generally painful.  The pain is worse with walking. She does not complain of clicking and popping, or any laxity of the knee. She does not complain of leg swelling.   She has No Known Allergies.   She  has a past medical history of Allergy; Hypertension; and Thyroid disease.    She  reports that she has never smoked. She has never used smokeless tobacco. She reports that she does not drink alcohol or use drugs. She  reports that she currently engages in sexual activity. The patient  has no past surgical history on file.  Her family history includes Cancer in her brother, mother, sister, and sister; Hyperlipidemia in her mother; Hypertension in her mother.  ROS  The problem list and medications were reviewed and updated by myself where necessary and exist elsewhere in the encounter.   OBJECTIVE:  BP 134/76 (BP Location: Right Arm, Cuff Size: Normal)   Pulse 69   Temp 98.2 F (36.8 C) (Oral)   Resp 16   Ht 5\' 4"  (1.626 m)   Wt 184 lb 6.4 oz (83.6 kg)   SpO2 99%   BMI 31.65 kg/m   Physical Exam  Constitutional: She is oriented to person,  place, and time.  Cardiovascular: Normal rate and regular rhythm.   Pulmonary/Chest: Effort normal and breath sounds normal.  Musculoskeletal: Normal range of motion. She exhibits tenderness (right knee). She exhibits no edema or deformity.       Right wrist: She exhibits tenderness. She exhibits normal range of motion, no bony tenderness and no swelling.       Left wrist: She exhibits tenderness. She exhibits normal range of motion, no bony tenderness and no swelling.  Neurological: She is alert and oriented to person, place, and time.  Skin: Skin is warm and dry.    No results found for this or any previous visit (from the past 72 hour(s)).  No results found.  Lab Results  Component Value Date   CREATININE 0.85 08/12/2016   Lab Results  Component Value Date   TSH 8.36 (H) 08/12/2016   ASSESSMENT AND PLAN:  Clarann was seen today for back pain, knee pain and wrist pain.  Diagnoses and all orders for this visit:  Hypothyroidism, unspecified type -     TSH  Encounter for monitoring chronic NSAID therapy -     Creatinine, serum -     DG Knee Complete 4 Views Right; Future  Acute pain of right knee: Most likely acute on  chronic.  I am putting her out of work for the next two days.   Chronic bilateral low back pain without sciatica -     meloxicam (MOBIC) 15 MG tablet; Take 0.5-1 tablets (7.5-15 mg total) by mouth daily. Okay to take tylenol 1000 mg every 8 hours with this medication.  Pain in both wrists: I will keep her on meloxicam as long as her CR is normal and she is not having any gerd symptoms.   -     meloxicam (MOBIC) 15 MG tablet; Take 0.5-1 tablets (7.5-15 mg total) by mouth daily. Okay to take tylenol 1000 mg every 8 hours with this medication.    The patient is advised to call or return to clinic if she does not see an improvement in symptoms, or to seek the care of the closest emergency department if she worsens with the above plan.   Deliah BostonMichael Ciarra Braddy, MHS,  PA-C [facility] 02/16/2017 12:15 PM

## 2017-02-16 NOTE — Patient Instructions (Signed)
     IF you received an x-ray today, you will receive an invoice from [facility]. Please contact [facility] at [phone] with questions or concerns regarding your invoice.   IF you received labwork today, you will receive an invoice from [facility]. Please contact [facility] at [phone] with questions or concerns regarding your invoice.   Our billing staff will not be able to assist you with questions regarding bills from these companies.  You will be contacted with the lab results as soon as they are available. The fastest way to get your results is to activate your My Chart account. Instructions are located on the last page of this paperwork. If you have not heard from us regarding the results in 2 weeks, please contact this office.     

## 2017-02-17 LAB — CREATININE, SERUM
Creatinine, Ser: 0.84 mg/dL (ref 0.57–1.00)
GFR calc Af Amer: 85 mL/min/{1.73_m2} (ref >59)
GFR, EST NON AFRICAN AMERICAN: 74 mL/min/{1.73_m2} (ref >59)

## 2017-02-17 LAB — TSH: TSH: 1.6 u[IU]/mL (ref 0.450–4.500)

## 2017-02-23 ENCOUNTER — Ambulatory Visit: Payer: BLUE CROSS/BLUE SHIELD | Admitting: Physician Assistant

## 2017-02-25 ENCOUNTER — Ambulatory Visit: Payer: BLUE CROSS/BLUE SHIELD

## 2017-03-02 ENCOUNTER — Ambulatory Visit (INDEPENDENT_AMBULATORY_CARE_PROVIDER_SITE_OTHER): Payer: BLUE CROSS/BLUE SHIELD | Admitting: Physician Assistant

## 2017-03-02 ENCOUNTER — Encounter: Payer: Self-pay | Admitting: Physician Assistant

## 2017-03-02 VITALS — BP 127/87 | HR 72 | Temp 98.0°F | Resp 16 | Ht 64.0 in | Wt 184.0 lb

## 2017-03-02 DIAGNOSIS — Z1239 Encounter for other screening for malignant neoplasm of breast: Secondary | ICD-10-CM

## 2017-03-02 DIAGNOSIS — Z1211 Encounter for screening for malignant neoplasm of colon: Secondary | ICD-10-CM

## 2017-03-02 DIAGNOSIS — Z0000 Encounter for general adult medical examination without abnormal findings: Secondary | ICD-10-CM | POA: Diagnosis not present

## 2017-03-02 DIAGNOSIS — Z1329 Encounter for screening for other suspected endocrine disorder: Secondary | ICD-10-CM

## 2017-03-02 DIAGNOSIS — Z1231 Encounter for screening mammogram for malignant neoplasm of breast: Secondary | ICD-10-CM | POA: Diagnosis not present

## 2017-03-02 DIAGNOSIS — Z130 Encounter for screening for diseases of the blood and blood-forming organs and certain disorders involving the immune mechanism: Secondary | ICD-10-CM | POA: Diagnosis not present

## 2017-03-02 DIAGNOSIS — Z131 Encounter for screening for diabetes mellitus: Secondary | ICD-10-CM

## 2017-03-02 DIAGNOSIS — Z13220 Encounter for screening for lipoid disorders: Secondary | ICD-10-CM

## 2017-03-02 NOTE — Patient Instructions (Signed)
     IF you received an x-ray today, you will receive an invoice from [facility]. Please contact [facility] at [phone] with questions or concerns regarding your invoice.   IF you received labwork today, you will receive an invoice from [facility]. Please contact [facility] at [phone] with questions or concerns regarding your invoice.   Our billing staff will not be able to assist you with questions regarding bills from these companies.  You will be contacted with the lab results as soon as they are available. The fastest way to get your results is to activate your My Chart account. Instructions are located on the last page of this paperwork. If you have not heard from us regarding the results in 2 weeks, please contact this office.     

## 2017-03-02 NOTE — Progress Notes (Signed)
03/02/2017 9:54 AM   DOB: 1951/06/08 / MRN: [id number]  SUBJECTIVE:  Amanda Kemp is a 66 y.o. female presenting for annual exam.  Most recent pap negative in 2016.  She has never been for colonoscopy despite several attempts at referral. Most recent breast screenings did show some lymphadenopathy in August of 2016, however US ruled out suspicion for malignancy and the patient was advised to come back for standard screening in on year. Per [facility] she did not represent for this despite another referral.    Immunization History  Administered Date(s) Administered  . Influenza-Unspecified 09/15/2016  . Tdap 05/21/2015    Current Outpatient Prescriptions:  .  Cetirizine HCl 10 MG CAPS, Take 1 capsule (10 mg total) by mouth daily., Disp: 30 capsule, Rfl: 6 .  fluticasone (FLONASE) 50 MCG/ACT nasal spray, Place 2 sprays into both nostrils daily., Disp: 16 g, Rfl: 6 .  levothyroxine (SYNTHROID, LEVOTHROID) 100 MCG tablet, Take 1 tablet (100 mcg total) by mouth daily. Please stop taking 88 mcg daily and take this instead., Disp: 90 tablet, Rfl: 3 .  lovastatin (MEVACOR) 20 MG tablet, Take 1 tablet (20 mg total) by mouth at bedtime., Disp: 90 tablet, Rfl: 1 .  meloxicam (MOBIC) 15 MG tablet, Take 0.5-1 tablets (7.5-15 mg total) by mouth daily. Okay to take tylenol 1000 mg every 8 hours with this medication., Disp: 30 tablet, Rfl: 3 .  ranitidine (ZANTAC) 150 MG tablet, Take 1 tablet (150 mg total) by mouth 2 (two) times daily., Disp: 30 tablet, Rfl: 0   She has No Known Allergies.   She  has a past medical history of Allergy; Hypertension; and Thyroid disease.    She  reports that she has never smoked. She has never used smokeless tobacco. She reports that she does not drink alcohol or use drugs. She  reports that she currently engages in sexual activity. The patient  has no past surgical history on file.  Her family history includes Cancer in her brother, mother, sister, and sister;  Hyperlipidemia in her mother; Hypertension in her mother.  Review of Systems  Constitutional: Negative for chills and fever.  Respiratory: Negative for shortness of breath.   Gastrointestinal: Negative for abdominal pain, nausea and vomiting.  Genitourinary: Negative for dysuria, frequency, hematuria and urgency.       Negative for vaginal bleeding  Musculoskeletal: Positive for back pain (chronic) and myalgias (chronic).  Skin: Negative for rash.  Neurological: Negative for dizziness, tingling, focal weakness and headaches.  Psychiatric/Behavioral: The patient does not have insomnia.     The problem list and medications were reviewed and updated by myself where necessary and exist elsewhere in the encounter.   OBJECTIVE:  BP 127/87   Pulse 72   Temp 98 F (36.7 C) (Oral)   Resp 16   Ht 5\' 4"  (1.626 m)   Wt 184 lb (83.5 kg)   SpO2 98%   BMI 31.58 kg/m   Physical Exam  Constitutional: She is oriented to person, place, and time. She appears well-developed and well-nourished. No distress.  Eyes: Conjunctivae and EOM are normal. Pupils are equal, round, and reactive to light.  Cardiovascular: Normal rate, regular rhythm, normal heart sounds and intact distal pulses.   Pulmonary/Chest: Effort normal and breath sounds normal. No respiratory distress. She has no wheezes. She has no rales. She exhibits no tenderness.  Abdominal: Soft. Bowel sounds are normal.  Musculoskeletal: Normal range of motion.  Neurological: She is alert and oriented to person, place,  and time. She has normal reflexes. She displays normal reflexes. No cranial nerve deficit. She exhibits normal muscle tone. Coordination normal.  Skin: Skin is warm and dry. She is not diaphoretic.  Psychiatric: She has a normal mood and affect. Her behavior is normal. Judgment and thought content normal.    Lab Results  Component Value Date   CREATININE 0.84 02/16/2017   BUN 16 08/12/2016   NA 141 08/12/2016   K 4.2  08/12/2016   CL 103 08/12/2016   CO2 24 08/12/2016   Lab Results  Component Value Date   WBC 8.3 08/12/2016   HGB 13.1 08/12/2016   HCT 39.3 08/12/2016   MCV 78.4 (L) 08/12/2016   PLT 360 08/12/2016   Lab Results  Component Value Date   ALT 17 08/12/2016   AST 18 08/12/2016   ALKPHOS 69 08/12/2016   BILITOT 0.2 08/12/2016   Lab Results  Component Value Date   CHOL 291 (H) 02/09/2016   HDL 48 02/09/2016   LDLCALC 218 (H) 02/09/2016   TRIG 125 02/09/2016   CHOLHDL 6.1 (H) 02/09/2016   Lab Results  Component Value Date   HGBA1C 6.0 (H) 11/27/2015      No results found for this or any previous visit (from the past 72 hour(s)).  No results found.  ASSESSMENT AND PLAN:  Amanda Kemp was seen today for annual exam.  Diagnoses and all orders for this visit:  Annual physical exam: I have stressed the importance of getting her colonoscopy and mammograms.  She tells me she will go this time.  Aside from low back pain she is relatively healthy and medication compliant. Next year she will need a PAP and pneumococcal.   Encounter for screening colonoscopy -     Ambulatory referral to Gastroenterology  Screening for breast cancer -     MM DIGITAL SCREENING BILATERAL; Future  Screening for lipid disorders -     Lipid panel  Screening for deficiency anemia -     CBC  Screening for thyroid disorder -     TSH  Screening for diabetes mellitus -     Hemoglobin A1c    The patient is advised to call or return to clinic if she does not see an improvement in symptoms, or to seek the care of the closest emergency department if she worsens with the above plan.   Amanda Kemp, MHS, PA-C [facility] 03/02/2017 9:54 AM

## 2017-03-03 LAB — LIPID PANEL
CHOL/HDL RATIO: 7.4 ratio — AB (ref 0.0–4.4)
Cholesterol, Total: 342 mg/dL — ABNORMAL HIGH (ref 100–199)
HDL: 46 mg/dL (ref >39)
LDL Calculated: 267 mg/dL — ABNORMAL HIGH (ref 0–99)
Triglycerides: 147 mg/dL (ref 0–149)
VLDL Cholesterol Cal: 29 mg/dL (ref 5–40)

## 2017-03-03 LAB — CBC
HEMATOCRIT: 39.2 % (ref 34.0–46.6)
Hemoglobin: 13 g/dL (ref 11.1–15.9)
MCH: 26 pg — ABNORMAL LOW (ref 26.6–33.0)
MCHC: 33.2 g/dL (ref 31.5–35.7)
MCV: 78 fL — AB (ref 79–97)
PLATELETS: 354 10*3/uL (ref 150–379)
RBC: 5 x10E6/uL (ref 3.77–5.28)
RDW: 15.6 % — AB (ref 12.3–15.4)
WBC: 7.5 10*3/uL (ref 3.4–10.8)

## 2017-03-03 LAB — HEMOGLOBIN A1C
Est. average glucose Bld gHb Est-mCnc: 123 mg/dL
Hgb A1c MFr Bld: 5.9 % — ABNORMAL HIGH (ref 4.8–5.6)

## 2017-03-03 LAB — TSH: TSH: 1.94 u[IU]/mL (ref 0.450–4.500)

## 2017-03-04 ENCOUNTER — Telehealth: Payer: Self-pay | Admitting: Physician Assistant

## 2017-03-04 ENCOUNTER — Telehealth: Payer: Self-pay | Admitting: *Deleted

## 2017-03-04 MED ORDER — ROSUVASTATIN CALCIUM 10 MG PO TABS: 5.0000 mg | ORAL_TABLET | Freq: Every day | ORAL | 3 refills | Status: DC

## 2017-03-04 NOTE — Telephone Encounter (Signed)
Please give her a call.  The cholesterol medication that she is currently on is not working in the least.   Lab Results  Component Value Date   CHOL 342 (H) 03/02/2017   HDL 46 03/02/2017   LDLCALC 267 (H) 03/02/2017   TRIG 147 03/02/2017   CHOLHDL 7.4 (H) 03/02/2017    I need her to start taking a low dose of crestor.  I have sent it to the pharmacy and would like her to start this immediately and stop her other.  Deliah BostonMichael Clark, MS, PA-C 2:35 PM, 03/04/2017

## 2017-03-04 NOTE — Telephone Encounter (Signed)
LM that patient is to start crestor and stop other cholesterol medication.   Told to call if any questions Per Chestine Sporelark.

## 2017-03-04 NOTE — Progress Notes (Signed)
Please add on peripheral smear, iron, TIBC.

## 2017-03-07 LAB — IRON AND TIBC
IRON SATURATION: 26 % (ref 15–55)
IRON: 80 ug/dL (ref 27–139)
Total Iron Binding Capacity: 302 ug/dL (ref 250–450)
UIBC: 222 ug/dL (ref 118–369)

## 2017-03-07 LAB — SPECIMEN STATUS REPORT

## 2017-04-03 ENCOUNTER — Encounter: Payer: Self-pay | Admitting: Physician Assistant

## 2017-06-04 ENCOUNTER — Ambulatory Visit: Payer: BLUE CROSS/BLUE SHIELD | Admitting: Family Medicine

## 2017-07-29 ENCOUNTER — Ambulatory Visit (INDEPENDENT_AMBULATORY_CARE_PROVIDER_SITE_OTHER): Payer: BLUE CROSS/BLUE SHIELD

## 2017-07-29 ENCOUNTER — Other Ambulatory Visit: Payer: Self-pay | Admitting: Emergency Medicine

## 2017-07-29 ENCOUNTER — Ambulatory Visit (INDEPENDENT_AMBULATORY_CARE_PROVIDER_SITE_OTHER): Payer: BLUE CROSS/BLUE SHIELD | Admitting: Physician Assistant

## 2017-07-29 ENCOUNTER — Encounter: Payer: Self-pay | Admitting: Physician Assistant

## 2017-07-29 VITALS — BP 144/82 | HR 65 | Temp 97.5°F | Resp 16 | Ht 64.0 in | Wt 185.6 lb

## 2017-07-29 DIAGNOSIS — M545 Low back pain, unspecified: Secondary | ICD-10-CM

## 2017-07-29 DIAGNOSIS — N200 Calculus of kidney: Secondary | ICD-10-CM

## 2017-07-29 DIAGNOSIS — E039 Hypothyroidism, unspecified: Secondary | ICD-10-CM | POA: Diagnosis not present

## 2017-07-29 DIAGNOSIS — Z1211 Encounter for screening for malignant neoplasm of colon: Secondary | ICD-10-CM

## 2017-07-29 DIAGNOSIS — G43A0 Cyclical vomiting, not intractable: Secondary | ICD-10-CM

## 2017-07-29 DIAGNOSIS — E785 Hyperlipidemia, unspecified: Secondary | ICD-10-CM

## 2017-07-29 DIAGNOSIS — Z87442 Personal history of urinary calculi: Secondary | ICD-10-CM

## 2017-07-29 LAB — POCT CBC
Granulocyte percent: 49.6 %G (ref 37–80)
HEMATOCRIT: 42 % (ref 37.7–47.9)
HEMOGLOBIN: 13.8 g/dL (ref 12.2–16.2)
Lymph, poc: 4.1 — AB (ref 0.6–3.4)
MCH, POC: 25.9 pg — AB (ref 27–31.2)
MCHC: 32.8 g/dL (ref 31.8–35.4)
MCV: 79 fL — AB (ref 80–97)
MID (cbc): 0.3 (ref 0–0.9)
MPV: 7.1 fL (ref 0–99.8)
PLATELET COUNT, POC: 124 10*3/uL — AB (ref 142–424)
POC GRANULOCYTE: 4.3 (ref 2–6.9)
POC LYMPH PERCENT: 47.2 %L (ref 10–50)
POC MID %: 3.2 %M (ref 0–12)
RBC: 5.32 M/uL (ref 4.04–5.48)
RDW, POC: 15.4 %
WBC: 8.6 10*3/uL (ref 4.6–10.2)

## 2017-07-29 LAB — POCT URINALYSIS DIP (MANUAL ENTRY)
BILIRUBIN UA: NEGATIVE
GLUCOSE UA: NEGATIVE mg/dL
Ketones, POC UA: NEGATIVE mg/dL
LEUKOCYTES UA: NEGATIVE
NITRITE UA: NEGATIVE
PH UA: 6 (ref 5.0–8.0)
Protein Ur, POC: NEGATIVE mg/dL
Spec Grav, UA: 1.01 (ref 1.010–1.025)
Urobilinogen, UA: 0.2 E.U./dL

## 2017-07-29 MED ORDER — LEVOTHYROXINE SODIUM 100 MCG PO TABS: 100.0000 ug | ORAL_TABLET | Freq: Every day | ORAL | 1 refills | Status: DC

## 2017-07-29 MED ORDER — NAPROXEN 500 MG PO TABS: 500.0000 mg | ORAL_TABLET | Freq: Two times a day (BID) | ORAL | 0 refills | Status: DC

## 2017-07-29 MED ORDER — ONDANSETRON 4 MG PO TBDP
4.0000 mg | ORAL_TABLET | Freq: Once | ORAL | Status: AC
  Administered 2017-07-29: 4 mg via ORAL

## 2017-07-29 MED ORDER — FAMOTIDINE 20 MG PO TABS: 20.0000 mg | ORAL_TABLET | Freq: Every evening | ORAL | 1 refills | Status: AC | PRN

## 2017-07-29 MED ORDER — ROSUVASTATIN CALCIUM 10 MG PO TABS: 5.0000 mg | ORAL_TABLET | Freq: Every day | ORAL | 1 refills | Status: DC

## 2017-07-29 MED ORDER — FAMOTIDINE 20 MG PO TABS: 20.0000 mg | ORAL_TABLET | Freq: Every evening | ORAL | 1 refills | Status: DC | PRN

## 2017-07-29 MED ORDER — TAMSULOSIN HCL 0.4 MG PO CAPS: 0.4000 mg | ORAL_CAPSULE | Freq: Every day | ORAL | 0 refills | Status: DC

## 2017-07-29 NOTE — Progress Notes (Signed)
07/29/2017 10:48 AM   DOB: 1951-11-08 / MRN: [id number]  SUBJECTIVE:  Amanda Kemp is a 66 y.o. female presenting for nausea that started 3 days ago.  One episode of non bloody emesis.  Associates mild bilateral back pain.  Denies hematuria but does have some "pressure" like dysuria.  Is having difficulty with sleeping due to the pain.  No fever or chills. Denies dirrahea.  Would like an order of colon screening today as she is behind on this.   She tells me she has not been taking her medication because she has not gone back to the pharmacy.    Pressure is mildly elevated today and previous measures are largely normal.   She has No Known Allergies.   She  has a past medical history of Allergy; Hypertension; and Thyroid disease.    She  reports that she has never smoked. She has never used smokeless tobacco. She reports that she does not drink alcohol or use drugs. She  reports that she currently engages in sexual activity. The patient  has no past surgical history on file.  Her family history includes Cancer in her brother, mother, sister, and sister; Hyperlipidemia in her mother; Hypertension in her mother.  Review of Systems  Constitutional: Negative for chills, diaphoresis and fever.  Respiratory: Negative for cough, hemoptysis, sputum production, shortness of breath and wheezing.   Cardiovascular: Negative for chest pain, orthopnea and leg swelling.  Gastrointestinal: Positive for abdominal pain (generalized). Negative for blood in stool, constipation, diarrhea, heartburn, melena, nausea and vomiting.  Genitourinary: Negative for flank pain.  Skin: Negative for rash.  Neurological: Negative for dizziness.    The problem list and medications were reviewed and updated by myself where necessary and exist elsewhere in the encounter.   OBJECTIVE:  BP (!) 144/82 (BP Location: Left Arm, Patient Position: Sitting, Cuff Size: Normal)   Pulse 65   Temp (!) 97.5 F (36.4 C)  (Oral)   Resp 16   Ht 5\' 4"  (1.626 m)   Wt 185 lb 9.6 oz (84.2 kg)   SpO2 98%   BMI 31.86 kg/m   Physical Exam  Constitutional: She is oriented to person, place, and time. She is active.  Non-toxic appearance.  Cardiovascular: Normal rate and regular rhythm.   Pulmonary/Chest: Effort normal and breath sounds normal. No tachypnea.  Musculoskeletal: Normal range of motion. She exhibits no edema.  Neurological: She is alert and oriented to person, place, and time. No cranial nerve deficit.  Skin: Skin is warm and dry. She is not diaphoretic. No pallor.   BP Readings from Last 3 Encounters:  07/29/17 (!) 144/82  03/02/17 127/87  02/16/17 134/76     Results for orders placed or performed in visit on 07/29/17 (from the past 72 hour(s))  POCT urinalysis dipstick     Status: Abnormal   Collection Time: 07/29/17  9:20 AM  Result Value Ref Range   Color, UA yellow yellow   Clarity, UA clear clear   Glucose, UA negative negative mg/dL   Bilirubin, UA negative negative   Ketones, POC UA negative negative mg/dL   Spec Grav, UA 4.098 1.191 - 1.025   Blood, UA trace-lysed (A) negative   pH, UA 6.0 5.0 - 8.0   Protein Ur, POC negative negative mg/dL   Urobilinogen, UA 0.2 0.2 or 1.0 E.U./dL   Nitrite, UA Negative Negative   Leukocytes, UA Negative Negative  POCT CBC     Status: Abnormal   Collection  Time: 07/29/17  9:20 AM  Result Value Ref Range   WBC 8.6 4.6 - 10.2 K/uL   Lymph, poc 4.1 (A) 0.6 - 3.4   POC LYMPH PERCENT 47.2 10 - 50 %L   MID (cbc) 0.3 0 - 0.9   POC MID % 3.2 0 - 12 %M   POC Granulocyte 4.3 2 - 6.9   Granulocyte percent 49.6 37 - 80 %G   RBC 5.32 4.04 - 5.48 M/uL   Hemoglobin 13.8 12.2 - 16.2 g/dL   HCT, POC 82.9 56.2 - 47.9 %   MCV 79.0 (A) 80 - 97 fL   MCH, POC 25.9 (A) 27 - 31.2 pg   MCHC 32.8 31.8 - 35.4 g/dL   RDW, POC 13.0 %   Platelet Count, POC 124 (A) 142 - 424 K/uL   MPV 7.1 0 - 99.8 fL        Dg Abd 2 Views  Result Date:  07/29/2017 CLINICAL DATA:  Nausea and back pain for 2 weeks.  Hematuria. EXAM: ABDOMEN - 2 VIEW COMPARISON:  01/26/2017 FINDINGS: The bowel gas pattern demonstrates scattered air and stool throughout the colon and scattered air throughout the small bowel but no dilatation or air-fluid levels. No free air. The soft tissue shadows are grossly maintained. Rounded calcification noted near the left psoas muscle between the L1 and L2 transverse processes. This was also present on the prior lumbar spine series from February 2018. It appears to be medial to the left kidney and could be in the collecting system. No pelvic calcifications to suggest distal ureteral or bladder calculi. The bony structures are unremarkable. Stable degenerative changes involving the lower lumbar spine. IMPRESSION: Stable left-sided calcification at the L2 level possibly in the left renal collecting system. No definite distal ureteral or bladder calculi. Unremarkable bowel gas pattern. Electronically Signed   By: Rudie Meyer M.D.   On: 07/29/2017 10:11    ASSESSMENT AND PLAN:  Jailee was seen today for nausea.  Diagnoses and all orders for this visit:  Cyclical vomiting with nausea, intractability of vomiting not specified: Symptoms, urine, and exam pointing towards a stone. Will treat that etiology and further quantify with a CT.  -     ondansetron (ZOFRAN-ODT) disintegrating tablet 4 mg; Take 1 tablet (4 mg total) by mouth once. -     POCT urinalysis dipstick -     POCT CBC -     CMP and Liver -     Recheck vitals -     EKG 12-Lead -     DG Abd 2 Views; Future  History of nephrolithiasis  Screening for malignant neoplasm of colon -     Ambulatory referral to Gastroenterology  Acute bilateral low back pain without sciatica -     Cancel: DG Lumbar Spine Complete; Future  Hypothyroidism, unspecified type -     levothyroxine (SYNTHROID, LEVOTHROID) 100 MCG tablet; Take 1 tablet (100 mcg total) by mouth daily. Please  stop taking 88 mcg daily and take this instead.  Dyslipidemia -     rosuvastatin (CRESTOR) 10 MG tablet; Take 0.5-1 tablets (5-10 mg total) by mouth daily. Take 1/2 tab for one week then start taking the whole tab.  Nephrolithiasis -     CT RENAL STONE STUDY; Future -     tamsulosin (FLOMAX) 0.4 MG CAPS capsule; Take 1 capsule (0.4 mg total) by mouth daily. -     naproxen (NAPROSYN) 500 MG tablet; Take 1 tablet (500 mg total)  by mouth 2 (two) times daily with a meal. -     famotidine (PEPCID) 20 MG tablet; Take 1 tablet (20 mg total) by mouth at bedtime as needed for heartburn or indigestion.    The patient is advised to call or return to clinic if she does not see an improvement in symptoms, or to seek the care of the closest emergency department if she worsens with the above plan.   Deliah BostonMichael Bradie Lacock, MHS, PA-C Primary Care at [facility] 07/29/2017 10:48 AM

## 2017-07-29 NOTE — Patient Instructions (Addendum)
We recommend that you schedule a mammogram for breast cancer screening. Typically, you do not need a referral to do this. Please contact a local imaging center to schedule your mammogram.  [facility] - [phone]  *ask for the Radiology Department [facility] - [phone] or [phone]  [facility] - [phone] [facility] - [phone] [facility] - [phone]  *ask for the Radiology Department [facility] - [phone]  *ask for the Radiology Department [facility] - [phone]  *ask for the Mammography Department [facility] - [phone]     IF you received an x-ray today, you will receive an invoice from [facility]. Please contact [facility] at [phone] with questions or concerns regarding your invoice.   IF you received labwork today, you will receive an invoice from [facility]. Please contact [facility] at [phone] with questions or concerns regarding your invoice.   Our billing staff will not be able to assist you with questions regarding bills from these companies.  You will be contacted with the lab results as soon as they are available. The fastest way to get your results is to activate your My Chart account. Instructions are located on the last page of this paperwork. If you have not heard from us regarding the results in 2 weeks, please contact this office.      

## 2017-07-30 LAB — CMP AND LIVER
ALBUMIN: 4.7 g/dL (ref 3.6–4.8)
ALK PHOS: 70 IU/L (ref 39–117)
ALT: 15 IU/L (ref 0–32)
AST: 25 IU/L (ref 0–40)
BUN: 12 mg/dL (ref 8–27)
Bilirubin Total: <0.2 mg/dL (ref 0.0–1.2)
Bilirubin, Direct: 0.05 mg/dL (ref 0.00–0.40)
CO2: 16 mmol/L — ABNORMAL LOW (ref 20–29)
CREATININE: 0.94 mg/dL (ref 0.57–1.00)
Calcium: 9.6 mg/dL (ref 8.7–10.3)
Chloride: 106 mmol/L (ref 96–106)
GFR calc Af Amer: 74 mL/min/{1.73_m2} (ref >59)
GFR, EST NON AFRICAN AMERICAN: 64 mL/min/{1.73_m2} (ref >59)
Glucose: 80 mg/dL (ref 65–99)
POTASSIUM: 4.4 mmol/L (ref 3.5–5.2)
SODIUM: 142 mmol/L (ref 134–144)
TOTAL PROTEIN: 8.2 g/dL (ref 6.0–8.5)

## 2017-08-10 ENCOUNTER — Ambulatory Visit: Payer: BLUE CROSS/BLUE SHIELD | Admitting: Physician Assistant

## 2017-08-15 ENCOUNTER — Encounter: Payer: Self-pay | Admitting: Physician Assistant

## 2017-08-15 ENCOUNTER — Ambulatory Visit (INDEPENDENT_AMBULATORY_CARE_PROVIDER_SITE_OTHER): Payer: BLUE CROSS/BLUE SHIELD | Admitting: Physician Assistant

## 2017-08-15 VITALS — BP 138/76 | HR 67 | Temp 98.4°F | Resp 16 | Ht 64.0 in | Wt 181.2 lb

## 2017-08-15 DIAGNOSIS — Z1211 Encounter for screening for malignant neoplasm of colon: Secondary | ICD-10-CM | POA: Diagnosis not present

## 2017-08-15 DIAGNOSIS — Z23 Encounter for immunization: Secondary | ICD-10-CM

## 2017-08-15 DIAGNOSIS — R030 Elevated blood-pressure reading, without diagnosis of hypertension: Secondary | ICD-10-CM | POA: Diagnosis not present

## 2017-08-15 NOTE — Patient Instructions (Addendum)
Please go for you abdominal CT so we can further measure you kidneys for stones.  Please go for you colon screening.     IF you received an x-ray today, you will receive an invoice from [facility]. Please contact [facility] at [phone] with questions or concerns regarding your invoice.   IF you received labwork today, you will receive an invoice from [facility]. Please contact [facility] at [phone] with questions or concerns regarding your invoice.   Our billing staff will not be able to assist you with questions regarding bills from these companies.  You will be contacted with the lab results as soon as they are available. The fastest way to get your results is to activate your My Chart account. Instructions are located on the last page of this paperwork. If you have not heard from [place] regarding the results in 2 weeks, please contact this office.    Influenza (Flu) Vaccine (Inactivated or Recombinant): What You Need to Know 1. Why get vaccinated? Influenza ("flu") is a contagious disease that spreads around the [place] every year, usually between October and May. Flu is caused by influenza viruses, and is spread mainly by coughing, sneezing, and close contact. Anyone can get flu. Flu strikes suddenly and can last several days. Symptoms vary by age, but can include:  fever/chills  sore throat  muscle aches  fatigue  cough  headache  runny or stuffy nose  Flu can also lead to pneumonia and blood infections, and cause diarrhea and seizures in children. If you have a medical condition, such as heart or lung disease, flu can make it worse. Flu is more dangerous for some people. Infants and young children, people 44 years of age and older, pregnant women, and people with certain health conditions or a weakened immune system are at greatest risk. Each year thousands of people in the [place] die from flu, and many more are hospitalized. Flu vaccine  can:  keep you from getting flu,  make flu less severe if you do get it, and  keep you from spreading flu to your family and other people. 2. Inactivated and recombinant flu vaccines A dose of flu vaccine is recommended every flu season. Children 6 months through 69 years of age may need two doses during the same flu season. Everyone else needs only one dose each flu season. Some inactivated flu vaccines contain a very small amount of a mercury-based preservative called thimerosal. Studies have not shown thimerosal in vaccines to be harmful, but flu vaccines that do not contain thimerosal are available. There is no live flu virus in flu shots. They cannot cause the flu. There are many flu viruses, and they are always changing. Each year a new flu vaccine is made to protect against three or four viruses that are likely to cause disease in the upcoming flu season. But even when the vaccine doesn't exactly match these viruses, it may still provide some protection. Flu vaccine cannot prevent:  flu that is caused by a virus not covered by the vaccine, or  illnesses that look like flu but are not.  It takes about 2 weeks for protection to develop after vaccination, and protection lasts through the flu season. 3. Some people should not get this vaccine Tell the person who is giving you the vaccine:  If you have any severe, life-threatening allergies. If you ever had a life-threatening allergic reaction after a dose of flu vaccine, or have a severe allergy to any part  of this vaccine, you may be advised not to get vaccinated. Most, but not all, types of flu vaccine contain a small amount of egg protein.  If you ever had Guillain-Barr Syndrome (also called GBS). Some people with a history of GBS should not get this vaccine. This should be discussed with your doctor.  If you are not feeling well. It is usually okay to get flu vaccine when you have a mild illness, but you might be asked to come back  when you feel better.  4. Risks of a vaccine reaction With any medicine, including vaccines, there is a chance of reactions. These are usually mild and go away on their own, but serious reactions are also possible. Most people who get a flu shot do not have any problems with it. Minor problems following a flu shot include:  soreness, redness, or swelling where the shot was given  hoarseness  sore, red or itchy eyes  cough  fever  aches  headache  itching  fatigue  If these problems occur, they usually begin soon after the shot and last 1 or 2 days. More serious problems following a flu shot can include the following:  There may be a small increased risk of Guillain-Barre Syndrome (GBS) after inactivated flu vaccine. This risk has been estimated at 1 or 2 additional cases per million people vaccinated. This is much lower than the risk of severe complications from flu, which can be prevented by flu vaccine.  Young children who get the flu shot along with pneumococcal vaccine (PCV13) and/or DTaP vaccine at the same time might be slightly more likely to have a seizure caused by fever. Ask your doctor for more information. Tell your doctor if a child who is getting flu vaccine has ever had a seizure.  Problems that could happen after any injected vaccine:  People sometimes faint after a medical procedure, including vaccination. Sitting or lying down for about 15 minutes can help prevent fainting, and injuries caused by a fall. Tell your doctor if you feel dizzy, or have vision changes or ringing in the ears.  Some people get severe pain in the shoulder and have difficulty moving the arm where a shot was given. This happens very rarely.  Any medication can cause a severe allergic reaction. Such reactions from a vaccine are very rare, estimated at about 1 in a million doses, and would happen within a few minutes to a few hours after the vaccination. As with any medicine, there is a  very remote chance of a vaccine causing a serious injury or death. The safety of vaccines is always being monitored. For more information, visit: http://floyd.org/www.cdc.gov/vaccinesafety/ 5. What if there is a serious reaction? What should I look for? Look for anything that concerns you, such as signs of a severe allergic reaction, very high fever, or unusual behavior. Signs of a severe allergic reaction can include hives, swelling of the face and throat, difficulty breathing, a fast heartbeat, dizziness, and weakness. These would start a few minutes to a few hours after the vaccination. What should I do?  If you think it is a severe allergic reaction or other emergency that can't wait, call 9-1-1 and get the person to the nearest hospital. Otherwise, call your doctor.  Reactions should be reported to the Vaccine Adverse Event Reporting System (VAERS). Your doctor should file this report, or you can do it yourself through the VAERS web site at www.vaers.LAgents.nohhs.gov, or by calling 1-269-553-2300. ? VAERS does not give  medical advice. 6. The National Vaccine Injury Compensation Program The [employer] Vaccine Injury Compensation Program (VICP) is a federal program that was created to compensate people who may have been injured by certain vaccines. Persons who believe they may have been injured by a vaccine can learn about the program and about filing a claim by calling 1-862 069 4587 or visiting the VICP website at SpiritualWord.at. There is a time limit to file a claim for compensation. 7. How can I learn more?  Ask your healthcare provider. He or she can give you the vaccine package insert or suggest other sources of information.  Call your local or state health department.  Contact the Centers for Disease Control and Prevention (CDC): ? Call 651-544-4439 (1-800-CDC-INFO) or ? Visit CDC's website at BiotechRoom.com.cy Vaccine Information Statement, Inactivated Influenza Vaccine (07/21/2014) This  information is not intended to replace advice given to you by your health care provider. Make sure you discuss any questions you have with your health care provider. Document Released: 09/25/2006 Document Revised: 08/21/2016 Document Reviewed: 08/21/2016 Elsevier Interactive Patient Education  2017 [employer].

## 2017-08-15 NOTE — Progress Notes (Signed)
08/15/2017 8:31 AM   DOB: 08/04/1951 / MRN: [id number]  SUBJECTIVE:  Amanda Kemp is a 66 y.o. female presenting for BP recheck given a mild elevation in her previous measure, however this was in the setting of a possible stone.  Tells me that her nausea is somewhat improved.   Adult vaccines due  Topic Date Due  . TETANUS/TDAP  05/20/2025   She has No Known Allergies.   She  has a past medical history of Allergy; Hypertension; and Thyroid disease.    She  reports that she has never smoked. She has never used smokeless tobacco. She reports that she does not drink alcohol or use drugs. She  reports that she currently engages in sexual activity. The patient  has no past surgical history on file.  Her family history includes Cancer in her brother, mother, sister, and sister; Hyperlipidemia in her mother; Hypertension in her mother.  Review of Systems  Constitutional: Negative for chills, diaphoresis and fever.  Gastrointestinal: Negative for nausea.  Genitourinary: Negative for dysuria and urgency.  Skin: Negative for rash.  Neurological: Negative for dizziness.    The problem list and medications were reviewed and updated by myself where necessary and exist elsewhere in the encounter.   OBJECTIVE:  BP 138/76   Pulse 67   Temp 98.4 F (36.9 C) (Oral)   Resp 16   Ht 5\' 4"  (1.626 m)   Wt 181 lb 3.2 oz (82.2 kg)   SpO2 98%   BMI 31.10 kg/m   BP Readings from Last 3 Encounters:  08/15/17 138/76  07/29/17 (!) 144/82  03/02/17 127/87   Wt Readings from Last 3 Encounters:  08/15/17 181 lb 3.2 oz (82.2 kg)  07/29/17 185 lb 9.6 oz (84.2 kg)  03/02/17 184 lb (83.5 kg)      Physical Exam  Constitutional: She is oriented to person, place, and time. She is active.  Non-toxic appearance.  Eyes: Pupils are equal, round, and reactive to light. EOM are normal.  Cardiovascular: Normal rate.   Pulmonary/Chest: Effort normal. No stridor. No tachypnea. No respiratory  distress. She has no wheezes. She has no rales.  Abdominal: Soft. Normal appearance and bowel sounds are normal. She exhibits no mass. There is no tenderness. There is no rigidity, no rebound, no guarding and no CVA tenderness.  Neurological: She is alert and oriented to person, place, and time. She has normal strength and normal reflexes. She is not disoriented. She displays no atrophy. No cranial nerve deficit or sensory deficit. She exhibits normal muscle tone. Coordination and gait normal.  Skin: Skin is warm and dry. She is not diaphoretic. No pallor.  Psychiatric: Her behavior is normal.   Lab Results  Component Value Date   WBC 8.6 07/29/2017   HGB 13.8 07/29/2017   HCT 42.0 07/29/2017   MCV 79.0 (A) 07/29/2017   PLT 354 03/02/2017    Lab Results  Component Value Date   NA 142 07/29/2017   K 4.4 07/29/2017   CL 106 07/29/2017   CO2 16 (L) 07/29/2017    Lab Results  Component Value Date   CREATININE 0.94 07/29/2017    Lab Results  Component Value Date   ALT 15 07/29/2017   AST 25 07/29/2017   ALKPHOS 70 07/29/2017   BILITOT <0.2 07/29/2017    Lab Results  Component Value Date   TSH 1.940 03/02/2017    Lab Results  Component Value Date   HGBA1C 5.9 (H) 03/02/2017  Lab Results  Component Value Date   CHOL 342 (H) 03/02/2017   HDL 46 03/02/2017   LDLCALC 267 (H) 03/02/2017   TRIG 147 03/02/2017   CHOLHDL 7.4 (H) 03/02/2017      No results found for this or any previous visit (from the past 72 hour(s)).  No results found.  ASSESSMENT AND PLAN:  Amanda MccreedyBarbara was seen today for bp check.  Diagnoses and all orders for this visit:  Elevated BP without diagnosis of hypertension: Reduced today without medication from the last visit. Will continue to monitor this.  RTC in three months for TSH labs and recheck BP.   Flu vaccine need -     Flu Vaccine QUAD 36+ mos IM    The patient is advised to call or return to clinic if she does not see an  improvement in symptoms, or to seek the care of the closest emergency department if she worsens with the above plan.   Deliah BostonMichael Lateshia Schmoker, MHS, PA-C Primary Care at [facility] 08/15/2017 8:31 AM

## 2017-08-18 NOTE — Progress Notes (Signed)
Thanks Pattricia BossAnnie good job. Deliah BostonMichael [facility], MS, PA-C 5:55 PM, 08/18/2017

## 2017-08-20 NOTE — Progress Notes (Signed)
Wonderful. Thank you.

## 2017-08-26 IMAGING — DX DG KNEE COMPLETE 4+V*R*
4 series · 4 of 4 positions shown · non-contrast
Comparison: None.

CLINICAL DATA: Right knee pain for 5 days

EXAM:
RIGHT KNEE - COMPLETE 4+ VIEW

[knee ap]
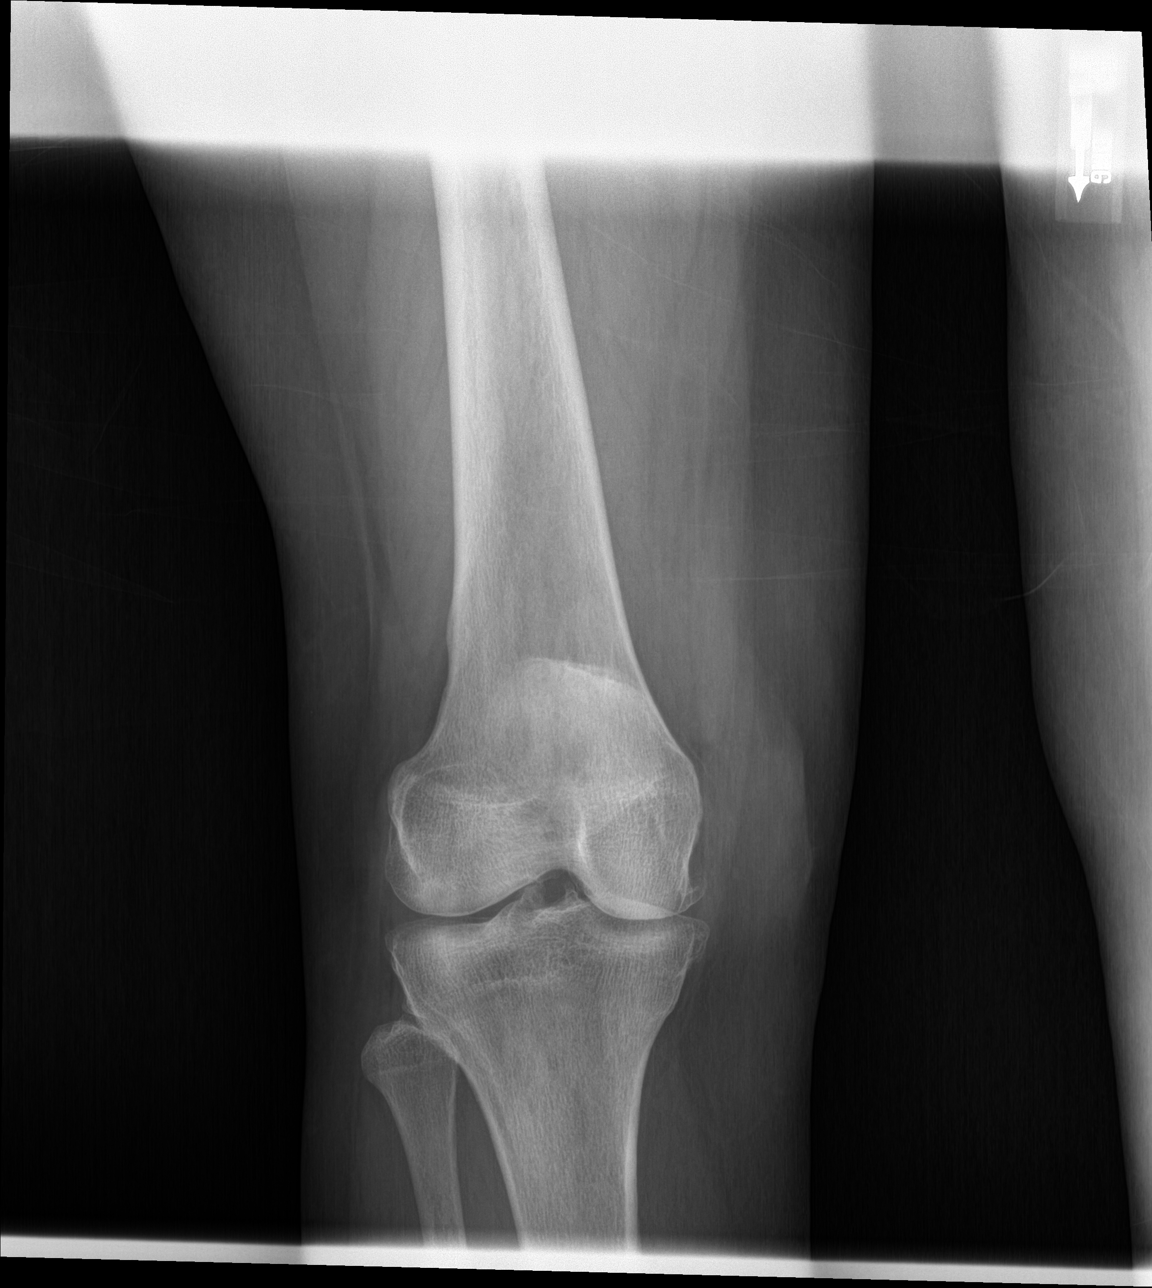

[knee lat]
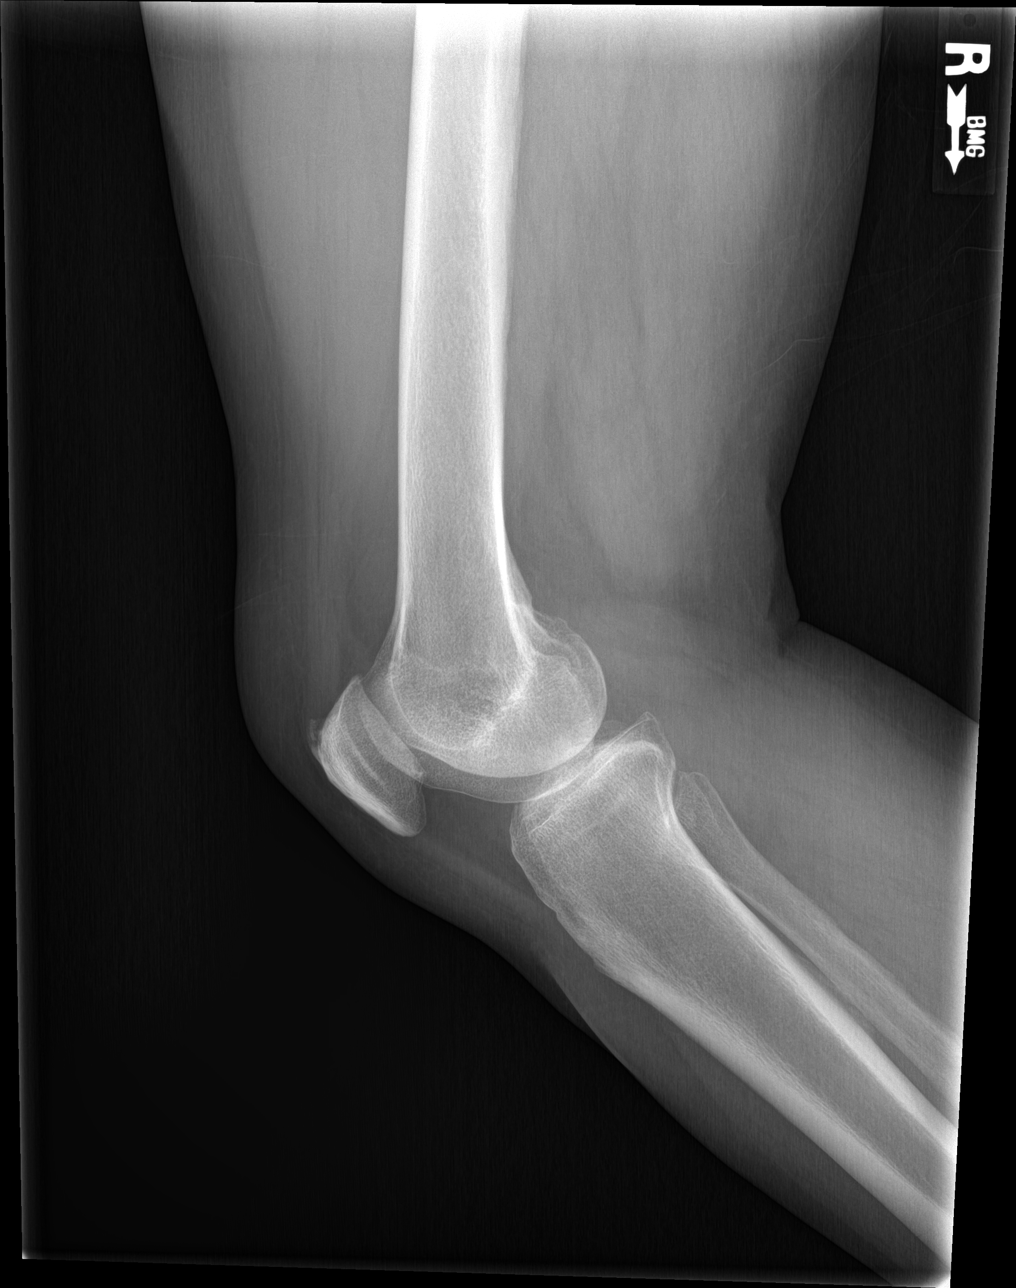

[sunrise]
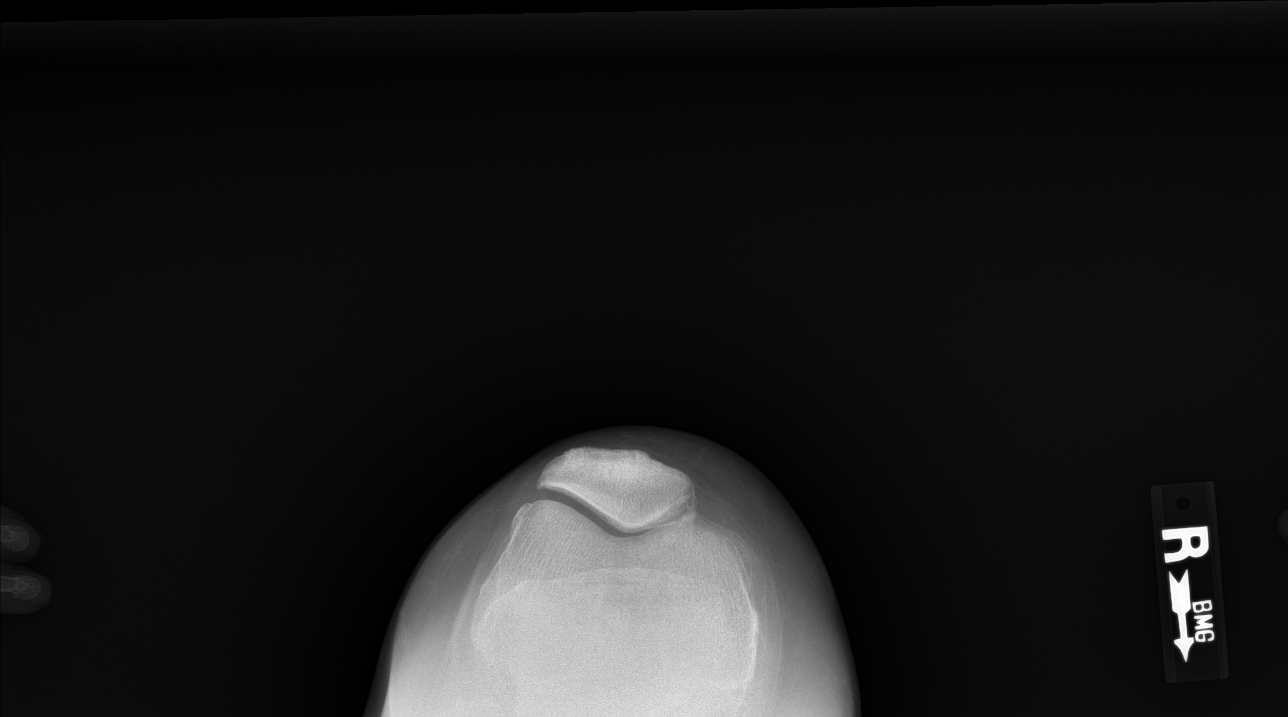

[knee [person_name]]
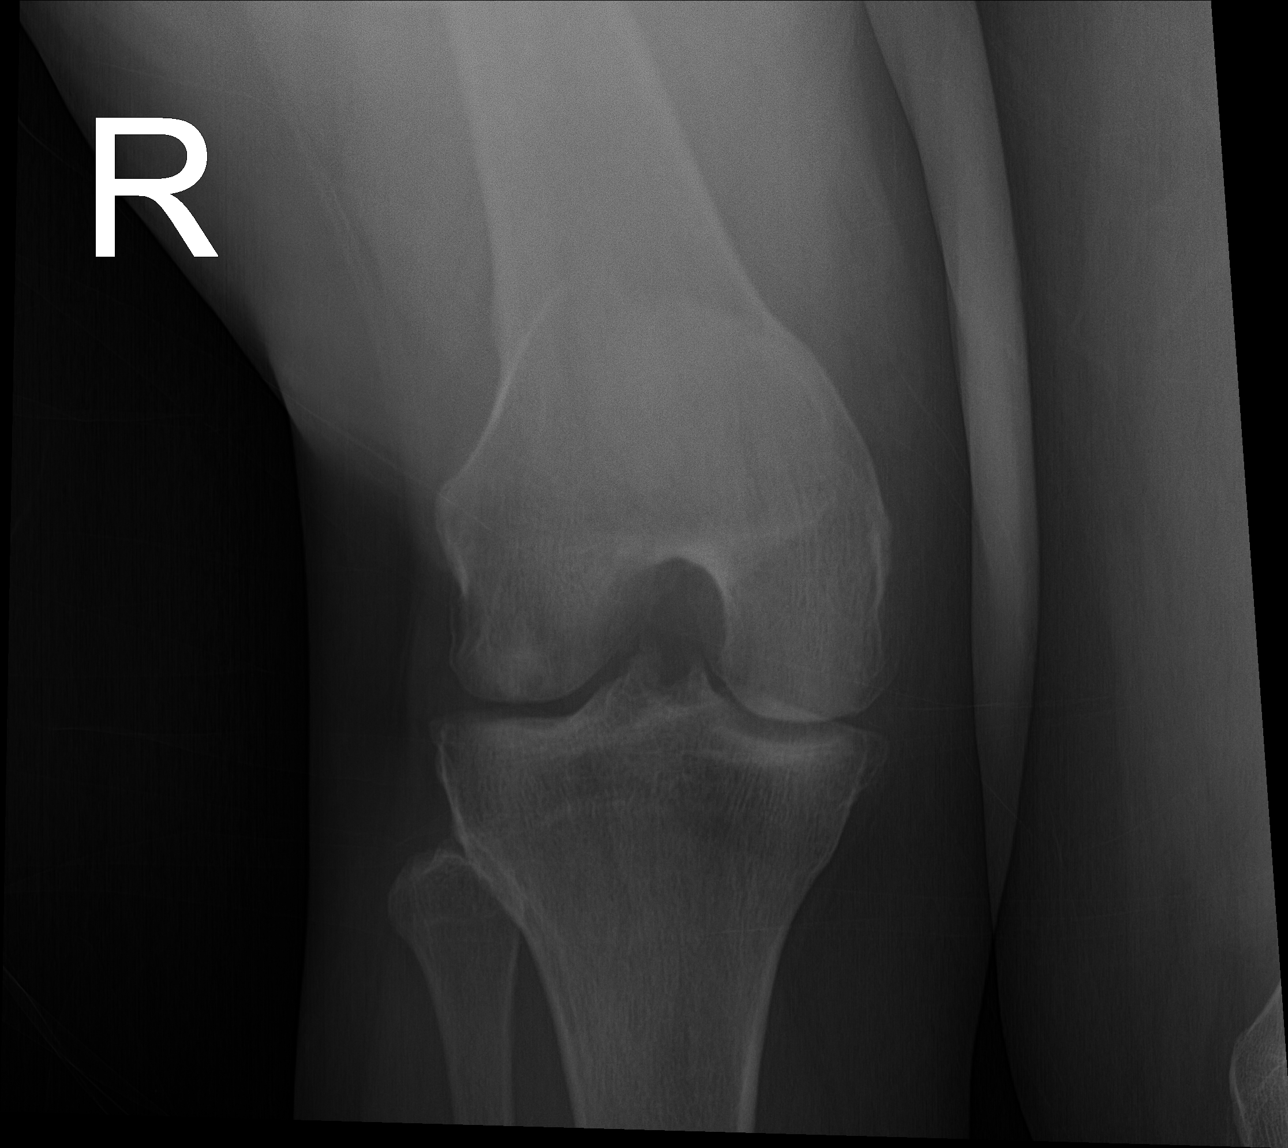

[4 of 4 positions shown; findings below may reference images not displayed]

FINDINGS: Degenerative changes with joint space narrowing and spurring most
pronounced in the medial compartment, also noted in the lateral and
patellofemoral compartments. No acute bony abnormality.
Specifically, no fracture, subluxation, or dislocation. Soft tissues
are intact. No joint effusion.
IMPRESSION: Mild to moderate degenerative changes, most pronounced in the medial
compartment. No acute bony abnormality.

## 2017-09-07 ENCOUNTER — Other Ambulatory Visit: Payer: No Typology Code available for payment source

## 2017-09-29 LAB — HM COLONOSCOPY

## 2017-10-01 ENCOUNTER — Encounter: Payer: Self-pay | Admitting: Physician Assistant

## 2017-10-28 ENCOUNTER — Emergency Department (HOSPITAL_COMMUNITY): Payer: Medicare Other

## 2017-10-28 ENCOUNTER — Telehealth: Payer: Self-pay | Admitting: General Practice

## 2017-10-28 ENCOUNTER — Emergency Department (HOSPITAL_COMMUNITY)
Admission: EM | Admit: 2017-10-28 | Discharge: 2017-10-28 | Disposition: A | Discharge status: Home / Self Care | Payer: Medicare Other | Attending: Emergency Medicine | Admitting: Emergency Medicine

## 2017-10-28 ENCOUNTER — Encounter (HOSPITAL_COMMUNITY): Payer: Self-pay

## 2017-10-28 ENCOUNTER — Ambulatory Visit: Payer: Self-pay | Admitting: *Deleted

## 2017-10-28 DIAGNOSIS — I10 Essential (primary) hypertension: Secondary | ICD-10-CM | POA: Insufficient documentation

## 2017-10-28 DIAGNOSIS — Z79899 Other long term (current) drug therapy: Secondary | ICD-10-CM | POA: Diagnosis not present

## 2017-10-28 DIAGNOSIS — R42 Dizziness and giddiness: Secondary | ICD-10-CM | POA: Diagnosis not present

## 2017-10-28 LAB — URINALYSIS, ROUTINE W REFLEX MICROSCOPIC
BACTERIA UA: NONE SEEN
BILIRUBIN URINE: NEGATIVE
Glucose, UA: NEGATIVE mg/dL
KETONES UR: NEGATIVE mg/dL
LEUKOCYTES UA: NEGATIVE
Nitrite: NEGATIVE
PROTEIN: NEGATIVE mg/dL
Specific Gravity, Urine: 1.009 (ref 1.005–1.030)
pH: 5 (ref 5.0–8.0)

## 2017-10-28 LAB — BASIC METABOLIC PANEL
ANION GAP: 7 (ref 5–15)
BUN: 11 mg/dL (ref 6–20)
CHLORIDE: 110 mmol/L (ref 101–111)
CO2: 24 mmol/L (ref 22–32)
CREATININE: 0.77 mg/dL (ref 0.44–1.00)
Calcium: 9.4 mg/dL (ref 8.9–10.3)
GFR calc non Af Amer: >60 mL/min (ref >60)
Glucose, Bld: 117 mg/dL — ABNORMAL HIGH (ref 65–99)
POTASSIUM: 3.7 mmol/L (ref 3.5–5.1)
Sodium: 141 mmol/L (ref 135–145)

## 2017-10-28 LAB — CBC
HEMATOCRIT: 40.7 % (ref 36.0–46.0)
HEMOGLOBIN: 13.3 g/dL (ref 12.0–15.0)
MCH: 26.5 pg (ref 26.0–34.0)
MCHC: 32.7 g/dL (ref 30.0–36.0)
MCV: 81.1 fL (ref 78.0–100.0)
Platelets: 287 10*3/uL (ref 150–400)
RBC: 5.02 MIL/uL (ref 3.87–5.11)
RDW: 14.8 % (ref 11.5–15.5)
WBC: 8.4 10*3/uL (ref 4.0–10.5)

## 2017-10-28 LAB — MAGNESIUM: Magnesium: 1.8 mg/dL (ref 1.7–2.4)

## 2017-10-28 LAB — CBG MONITORING, ED: Glucose-Capillary: 105 mg/dL — ABNORMAL HIGH (ref 65–99)

## 2017-10-28 MED ORDER — ONDANSETRON 4 MG PO TBDP
4.0000 mg | ORAL_TABLET | Freq: Once | ORAL | Status: AC
  Administered 2017-10-28: 4 mg via ORAL
  Filled 2017-10-28: qty 1

## 2017-10-28 MED ORDER — SODIUM CHLORIDE 0.9 % IV BOLUS (SEPSIS)
1000.0000 mL | Freq: Once | INTRAVENOUS | Status: AC
  Administered 2017-10-28: 1000 mL via INTRAVENOUS

## 2017-10-28 MED ORDER — FLUTICASONE PROPIONATE 50 MCG/ACT NA SUSP: 1.0000 | Freq: Every day | NASAL | 2 refills | Status: AC

## 2017-10-28 MED ORDER — SODIUM CHLORIDE 0.9 % IV BOLUS (SEPSIS): 1000.0000 mL | Freq: Once | INTRAVENOUS | Status: DC

## 2017-10-28 MED ORDER — MECLIZINE HCL 12.5 MG PO TABS: 12.5000 mg | ORAL_TABLET | Freq: Two times a day (BID) | ORAL | 0 refills | Status: DC | PRN

## 2017-10-28 MED ORDER — MECLIZINE HCL 25 MG PO TABS
12.5000 mg | ORAL_TABLET | Freq: Once | ORAL | Status: AC
  Administered 2017-10-28: 12.5 mg via ORAL
  Filled 2017-10-28: qty 1

## 2017-10-28 MED ORDER — ACETAMINOPHEN 325 MG PO TABS
650.0000 mg | ORAL_TABLET | Freq: Once | ORAL | Status: AC
  Administered 2017-10-28: 650 mg via ORAL
  Filled 2017-10-28: qty 2

## 2017-10-28 NOTE — Discharge Instructions (Addendum)
You were seen in the Emergency Department for dizziness today. Your labs drawn today were normal- we checked your blood cell counts, electrolytes, kidney function, and urine. Your head CT scan did not show any masses or bleeding. It did show potential signs of sinusitis consistent with your congestion. I have prescribed you Flonase, this is a nasal steroid that should help with your congestion, spray once in each nostril once per day. If your congestion has not resolved within 1 week or you start to have worsening symptoms, fevers, or sinus pain be sure to let your primary care provider know or return to the emergency department for further management.    Be sure to remain well hydrated. I have prescribed you Meclizine for dizziness, you make take 1 tablet of this every 12 hours as needed for dizziness.   You have been prescribed a new medication today. It is important that when you pick the prescription up you discuss the potential interactions of this medication with other medications you are taking, including over the counter medications, with the pharmacists.   This new medication has potential side effects. Be sure to contact your primary care provider or return to the emergency department if you are experiencing new symptoms that you are unable to tolerate after starting the medication. You need to receive medical evaluation immediately if you start to experience blistering of the skin, rash, swelling, or difficulty breathing as these signs could indicate a more serious medication side effect.    Follow up with your primary care provider in 2 days for re-evaluation. We would also like you to have a follow up EKG during your visit.   If you start to experience any new or worsening symptoms return to the emergency department including chest pain, palpitations, difficulty breathing, or an episode of fainting.

## 2017-10-28 NOTE — ED Provider Notes (Signed)
Medical screening examination/treatment/procedure(s) were conducted as a shared visit with non-physician practitioner(s) and myself.  I personally evaluated the patient during the encounter.  66 year old female with minimal medical history the presents to the emergency department today secondary to dizziness.  Patient states that over the last week she had intermittent episodes of feeling lightheaded and like the room is spinning and she might pass out when she stands up or sits up quickly from lying.  She states yesterday was actually that bad but then today came back and was worse again.  She has to hold the wall to walk.  She states that she had decreased intake secondary to the dizziness and not want to have to get up often to go to the bathroom or get drinks and she has had decreased urine output and has been darker as well.  States she denies symptoms like this before.  No recent illnesses besides some allergies. On exam she has normal cranial nerves is normal sensation upper and lower extremities she has decreased strength in her left arm which she thinks may be because she is left-handed but she has not noticed this discrepancy before.  She also has bilateral lower extremity strength that is normal. I suspect hypovolemia as a cause for her symptoms however patient does have a possible neuro deficit so we will do a head CT.  Otherwise fluids and meclizine and reevaluation of ability to walk. QT long, will check magnesium. After workup and interventions here, patient with significnat improvement. Doubt central nervous system or cardiac causes for symptoms at this time. Will planf or dc w/ pcp follow up.   EKG Interpretation  Date/Time:  Wednesday October 28 2017 12:20:59 EST Ventricular Rate:  74 PR Interval:  160 QRS Duration: 104 QT Interval:  460 QTC Calculation: 510 R Axis:   -49 Text Interpretation:   Poor data quality, interpretation may be adversely affected Normal sinus rhythm Left  anterior fascicular block Prolonged QT Abnormal ECG QT has lengthened Confirmed by Marily MemosMesner, Brandye Inthavong [phone]([id number]) on 10/28/2017 4:54:55 PM         Amanda Kemp, Emonii CowerJason, MD 10/30/17 1452

## 2017-10-28 NOTE — ED Triage Notes (Signed)
Per Pt and family, pt is coming from home with complaints of dizziness for one week. Reports having some lightheadedness and dizziness with ambulation. Denies unilateral weakness, aphasia, or confusion. Denies CP or SOB.

## 2017-10-28 NOTE — ED Notes (Signed)
Pt is aware that we need a urine sample, tolieting offered unable to void at this time.

## 2017-10-28 NOTE — ED Provider Notes (Signed)
[facility] Provider Note   CSN: [id number] Arrival date & time: 10/28/17  1145     History   Chief Complaint Chief Complaint  Patient presents with  . Dizziness    HPI Amanda Kemp is a 66 y.o. female with a hx of hypothyroidism and hyperlipidemia that presents with a complaint of dizziness x 1 week. Patient states dizziness started as intermittent and has progressed to constant today. Describes the dizziness as the room spinning with lightheadedness as if she may pass out, denies syncope. Additionally experiencing associated nausea without vomiting and difficulty ambulating due to dizziness. States she has been holding the wall when ambulating and has avoided getting up due to her symptoms. Has had intermittent headaches for a few weeks- mostly frontal with gradual onset, self limiting. Has had decreased PO intake and darker colored urine. Denies numbness, weakness, change in vision, confusion, chest pain, dyspnea, palpitations, fever, chills, ear pain, tinnitus, or hearing loss.   HPI  Past Medical History:  Diagnosis Date  . Allergy   . Hypertension   . Thyroid disease     Patient Active Problem List   Diagnosis Date Noted  . Sinus problem 04/15/2016  . Environmental and seasonal allergies 05/08/2015    Past Surgical History:  Procedure Laterality Date  . ABDOMINAL HYSTERECTOMY      OB History    No data available       Home Medications    Prior to Admission medications   Medication Sig Start Date End Date Taking? Authorizing Provider  famotidine (PEPCID) 20 MG tablet Take 1 tablet (20 mg total) by mouth at bedtime as needed for heartburn or indigestion. 07/29/17   Ofilia Neaslark, Michael L, PA-C  levothyroxine (SYNTHROID, LEVOTHROID) 100 MCG tablet Take 1 tablet (100 mcg total) by mouth daily. Please stop taking 88 mcg daily and take this instead. 07/29/17   Ofilia Neaslark, Michael L, PA-C  rosuvastatin (CRESTOR) 10 MG tablet Take  0.5-1 tablets (5-10 mg total) by mouth daily. Take 1/2 tab for one week then start taking the whole tab. 07/29/17   Ofilia Neaslark, Michael L, PA-C    Family History Family History  Problem Relation Age of Onset  . Hyperlipidemia Mother   . Hypertension Mother   . Cancer Mother   . Cancer Brother        prostate cancer  . Cancer Sister        kidney cancer  . Cancer Sister        lung cancer    Social History Social History   Tobacco Use  . Smoking status: Never Smoker  . Smokeless tobacco: Never Used  Substance Use Topics  . Alcohol use: No    Alcohol/week: 0.0 oz  . Drug use: No     Allergies   Patient has no known allergies.   Review of Systems Review of Systems  Constitutional: Positive for appetite change. Negative for chills and fever.  HENT: Positive for congestion. Negative for ear pain, hearing loss, rhinorrhea, sore throat and tinnitus.   Eyes: Negative for pain and visual disturbance.  Respiratory: Negative for cough, chest tightness and shortness of breath.   Cardiovascular: Negative for chest pain, palpitations and leg swelling.  Gastrointestinal: Positive for nausea. Negative for abdominal pain, constipation, diarrhea and vomiting.  Genitourinary: Positive for decreased urine volume. Negative for dysuria.  Musculoskeletal: Negative for neck pain.  Skin: Negative for rash.  Neurological: Positive for dizziness, light-headedness and headaches. Negative for syncope, speech difficulty,  weakness and numbness.  Psychiatric/Behavioral: Negative for confusion.  All other systems reviewed and are negative.    Physical Exam Updated Vital Signs BP (!) 145/77 (BP Location: Right Arm)   Pulse 72   Temp 98.4 F (36.9 C) (Oral)   Resp 17   Ht 5\' 5"  (1.651 m)   Wt 81.6 kg (180 lb)   SpO2 97%   BMI 29.95 kg/m   Physical Exam  Constitutional: She appears well-developed and well-nourished. No distress.  HENT:  Head: Normocephalic and atraumatic.  Right Ear:  Tympanic membrane normal. Tympanic membrane is not erythematous, not retracted and not bulging.  Left Ear: Tympanic membrane normal. Tympanic membrane is not erythematous, not retracted and not bulging.  Mouth/Throat: Mucous membranes are dry (mildly).  Eyes: Conjunctivae are normal.  Neck: Neck supple. No spinous process tenderness and no muscular tenderness present.  Cardiovascular: Normal rate and regular rhythm. Exam reveals no gallop and no friction rub.  No murmur heard. Pulmonary/Chest: Breath sounds normal. No respiratory distress. She has no wheezes. She has no rales.  Abdominal: Soft. She exhibits no distension. There is no tenderness.  Neurological: She is alert.  Alert. Clear speech. No facial droop. CNIII-XII are intact. Bilateral upper and lower extremities' sensation intact to sharp and dull touch. Decreased LUE grip strength in comparison to RUE. 5/5 plantar and dorsi flexion bilaterally. Normal finger to nose bilaterally. Negative pronator drift. Negative Romberg sign. Patient is unsteady when trying to ambulate.   Skin: Skin is warm and dry. No rash noted.  Psychiatric: She has a normal mood and affect. Her behavior is normal.  Nursing note and vitals reviewed.    ED Treatments / Results   Results for orders placed or performed during the hospital encounter of 10/28/17  Basic metabolic panel  Result Value Ref Range   Sodium 141 135 - 145 mmol/L   Potassium 3.7 3.5 - 5.1 mmol/L   Chloride 110 101 - 111 mmol/L   CO2 24 22 - 32 mmol/L   Glucose, Bld 117 (H) 65 - 99 mg/dL   BUN 11 6 - 20 mg/dL   Creatinine, Ser 1.61 0.44 - 1.00 mg/dL   Calcium 9.4 8.9 - 09.6 mg/dL   GFR calc non Af Amer >60 >60 mL/min   GFR calc Af Amer >60 >60 mL/min   Anion gap 7 5 - 15  CBC  Result Value Ref Range   WBC 8.4 4.0 - 10.5 K/uL   RBC 5.02 3.87 - 5.11 MIL/uL   Hemoglobin 13.3 12.0 - 15.0 g/dL   HCT 04.5 40.9 - 81.1 %   MCV 81.1 78.0 - 100.0 fL   MCH 26.5 26.0 - 34.0 pg   MCHC 32.7  30.0 - 36.0 g/dL   RDW 91.4 78.2 - 95.6 %   Platelets 287 150 - 400 K/uL  Urinalysis, Routine w reflex microscopic  Result Value Ref Range   Color, Urine YELLOW YELLOW   APPearance CLEAR CLEAR   Specific Gravity, Urine 1.009 1.005 - 1.030   pH 5.0 5.0 - 8.0   Glucose, UA NEGATIVE NEGATIVE mg/dL   Hgb urine dipstick SMALL (A) NEGATIVE   Bilirubin Urine NEGATIVE NEGATIVE   Ketones, ur NEGATIVE NEGATIVE mg/dL   Protein, ur NEGATIVE NEGATIVE mg/dL   Nitrite NEGATIVE NEGATIVE   Leukocytes, UA NEGATIVE NEGATIVE   RBC / HPF 0-5 0 - 5 RBC/hpf   WBC, UA 0-5 0 - 5 WBC/hpf   Bacteria, UA NONE SEEN NONE SEEN   Squamous  Epithelial / LPF 0-5 (A) NONE SEEN   Mucus PRESENT   Magnesium  Result Value Ref Range   Magnesium 1.8 1.7 - 2.4 mg/dL  CBG monitoring, ED  Result Value Ref Range   Glucose-Capillary 105 (H) 65 - 99 mg/dL   Comment 1 Notify RN    Comment 2 Document in Chart    Ct Head Wo Contrast  Result Date: 10/28/2017 CLINICAL DATA:  Dizziness 1 week.  Hypertension. EXAM: CT HEAD WITHOUT CONTRAST TECHNIQUE: Contiguous axial images were obtained from the base of the skull through the vertex without intravenous contrast. COMPARISON:  None. FINDINGS: Brain: No evidence of acute infarction, hemorrhage, hydrocephalus, extra-axial collection or mass lesion/mass effect. Vascular: No hyperdense vessel or unexpected calcification. Skull: Normal. Negative for fracture or focal lesion. Sinuses/Orbits: The orbits are normal symmetric. Paranasal sinuses are well developed with opacification over the frontal ethmoidal recesses, ethmoidal cells and maxillary sinuses with small air-fluid level over the right maxillary sinus. Mastoid air cells are clear. Other: None. IMPRESSION: No acute intracranial findings. Inflammatory change of the sinuses with air-fluid level over the right maxillary sinus as this may be seen with acute sinusitis. Electronically Signed   By: Elberta Fortisaniel  Boyle M.D.   On: 10/28/2017 17:49    EKG  EKG Interpretation  Date/Time:  Wednesday October 28 2017 12:20:59 EST Ventricular Rate:  74 PR Interval:  160 QRS Duration: 104 QT Interval:  460 QTC Calculation: 510 R Axis:   -49 Text Interpretation:  Poor data quality, interpretation may be adversely affected Normal sinus rhythm Left anterior fascicular block Prolonged QT Abnormal ECG QT has lengthened Confirmed by Marily MemosMesner, Jason [phone]([id number]) on 10/28/2017 4:54:55 PM       Medications Ordered in ED Medications  sodium chloride 0.9 % bolus 1,000 mL (not administered)  ondansetron (ZOFRAN-ODT) disintegrating tablet 4 mg (4 mg Oral Given 10/28/17 1437)  sodium chloride 0.9 % bolus 1,000 mL (0 mLs Intravenous Stopped 10/28/17 2103)  meclizine (ANTIVERT) tablet 12.5 mg (12.5 mg Oral Given 10/28/17 1806)  acetaminophen (TYLENOL) tablet 650 mg (650 mg Oral Given 10/28/17 1831)     Initial Impression / Assessment and Plan / ED Course  I have reviewed the triage vital signs and the nursing notes.  Pertinent labs & imaging results that were available during my care of the patient were reviewed by me and considered in my medical decision making (see chart for details).  Patient presents with dizziness/lightheadedness for the past 1 week. She is nontoxic appearing with stable vital signs. Suspect orthostatic hypotension given symptom descriptions and triggers: however, given possible focal neurologic deficit with LUE weakness and with difficulty ambulating will get a CT. Urine ordered to evaluate for dehydration. EKG with prolonged QTc, BMP grossly normal will check a magnesium level, reviewed patient's med list. Will treat dizziness with fluids and meclizine at this time.   CT head negative for acute intracranial process therefore doubt bleed or mass, revealed findings consistent with sinusitis.  18:06: Re-eval: Discussed results of CT with patient and daughter who confirmed understanding. States her dizziness is improved. Complaining  of frontal headache will treat with Tylenol.   21:15: Re-eval: Patient feeling much better, HA resolved,  resting comfortably in bed, feels ready to go home, pending urine.   Discussed patient's lab, EKG, and imaging results with her and her daughter. Instructed patient to remain well hydrated as I believe her dizziness to be related to her decrease PO intake and likely hypovolemia at time of presentation. Patient improved with  fluids and Meclizine. Prescribed Meclizine for at home. Instructed patient to follow up with her primary care provider for re-evaluation and repeat EKG in 2 days given her QTc prolongation on ED EKG without clear etiology- electrolytes WNL, med list reviewed.  Provided emergency department return precautions including syncope, chest pain, palpitations, dyspnea, or any new/worsening symptoms.   Given congestion with concern for sinusitis on CT scan will treat congestion with Flonase, instructed patient to follow up with PCP or return to the ED for any fever, worsening congestion, or sinus pain as she may require an antibiotic if this does not resolve, suspect viral at this point.   Patient and daughter were provided opportunity to ask questions, confirmed understanding, comfortable with DC home and PCP follow-up.   Vitals:   10/28/17 2100 10/28/17 2225  BP: 114/66 134/74  Pulse: 68 70  Resp: 18 16  Temp:    SpO2: 97% 98%   Final Clinical Impressions(s) / ED Diagnoses   Final diagnoses:  Dizziness    ED Discharge Orders        Ordered    fluticasone (FLONASE) 50 MCG/ACT nasal spray  Daily     10/28/17 2217    meclizine (ANTIVERT) 12.5 MG tablet  2 times daily PRN     10/28/17 2217       Cherly Anderson, PA-C 10/28/17 2259    Marily Memos, MD 11/02/17 1323

## 2017-10-28 NOTE — Telephone Encounter (Signed)
Called in c/o being very dizzy, lightheaded, can't keep my balance.  Also have a little headache.  "I can't walk without holding on to the walls or chairs"  "The room is spinning around when I lay down or get up from my bed"   I feeling nauseated and have a little headache.   Saturday and Sunday the dizziness was real bad.  This all started around last Tuesday morning.  "My daughter is a Engineer, civil (consulting)nurse and thinks it may be vertigo"   Based on the protocol Pt was referred to the ED to r/o a possible stroke.  She denies weakness or numbness on one side of her body.   Her speech is clear and appropriate.  Able to answer my questions without hesitation. She wants to call her daughter who is a Engineer, civil (consulting)nurse and see if she can take her to the ED before calling 911.   If she can't get a hold of her she assured me she would call 911.   I encouraged her to go to the ED within the hour to be evaluated.   She acknowledged she would follow the instructions given by me. Reason for Disposition . SEVERE dizziness (vertigo) (e.g., unable to walk without assistance)  Answer Assessment - Initial Assessment Questions 1. DESCRIPTION: "Describe your dizziness."     I have to hold onto the walls and chair in my home to get around.  Started last week.   Saturday and Sunday it was really bad.   I didn't even go to church.   2. VERTIGO: "Do you feel like either you or the room is spinning or tilting?"      Yes.  Like everything is spinning around 3. LIGHTHEADED: "Do you feel lightheaded?" (e.g., somewhat faint, woozy, weak upon standing)     Yes.  When laying down and standing I feel dizzy and I have a little headache. 4. SEVERITY: "How bad is it?"  "Can you walk?"   - MILD - Feels unsteady but walking normally.   - MODERATE - Feels very unsteady when walking, but not falling; interferes with normal activities (e.g., school, work) .   - SEVERE - Unable to walk without falling (requires assistance).     I have to hold on to things to  walk. 5. ONSET:  "When did the dizziness begin?"     Started last week in the mornings it was happening a little bit white I was getting up. 6. AGGRAVATING FACTORS: "Does anything make it worse?" (e.g., standing, change in head position)     When I stand up it's like I'm going to pass out. 7. CAUSE: "What do you think is causing the dizziness?"     Vertigo  My daughter is a Engineer, civil (consulting)nurse and told me that's probably what it is. 8. RECURRENT SYMPTOM: "Have you had dizziness before?" If so, ask: "When was the last time?" "What happened that time?"     No   This is the first time. 9. OTHER SYMPTOMS: "Do you have any other symptoms?" (e.g., headache, weakness, numbness, vomiting, earache)     A little headache.  Feel nauseated.  I felt like I was going to throw up. 10. PREGNANCY: "Is there any chance you are pregnant?" "When was your last menstrual period?"       Not asked  Protocols used: DIZZINESS - VERTIGO-A-AH

## 2017-10-28 NOTE — Telephone Encounter (Signed)
Attempted to call pt back at home/mobile number.  Got her voicemail.   Did not leave a message.   I called her work number and they informed me,  "She didn't come in to work today due to an emergency".   I let them know I was just checking on her.   No information given out.

## 2017-10-28 NOTE — ED Notes (Signed)
Pt CBG 105 RN Cindy notified

## 2017-10-28 NOTE — ED Notes (Signed)
Patient transported to CT 

## 2017-10-28 NOTE — ED Notes (Signed)
Patient was called to reassess vital signs but no answer.

## 2017-10-28 NOTE — ED Notes (Signed)
Reminded pt we need a urine sample.  

## 2017-10-28 NOTE — ED Notes (Addendum)
Spoke with main lab, they will add-on Magnesium  to previous specimen

## 2017-11-02 ENCOUNTER — Encounter: Payer: Self-pay | Admitting: Physician Assistant

## 2017-11-02 ENCOUNTER — Ambulatory Visit: Payer: BLUE CROSS/BLUE SHIELD | Admitting: Physician Assistant

## 2017-11-02 VITALS — BP 142/86 | HR 83 | Temp 98.4°F | Resp 16 | Ht 65.0 in | Wt 180.0 lb

## 2017-11-02 DIAGNOSIS — E039 Hypothyroidism, unspecified: Secondary | ICD-10-CM

## 2017-11-02 DIAGNOSIS — H6983 Other specified disorders of Eustachian tube, bilateral: Secondary | ICD-10-CM | POA: Diagnosis not present

## 2017-11-02 DIAGNOSIS — H8113 Benign paroxysmal vertigo, bilateral: Secondary | ICD-10-CM

## 2017-11-02 DIAGNOSIS — R42 Dizziness and giddiness: Secondary | ICD-10-CM

## 2017-11-02 MED ORDER — DOXYCYCLINE HYCLATE 100 MG PO CAPS: 100.0000 mg | ORAL_CAPSULE | Freq: Two times a day (BID) | ORAL | 0 refills | Status: AC

## 2017-11-02 MED ORDER — METHYLPREDNISOLONE ACETATE 80 MG/ML IJ SUSP
80.0000 mg | Freq: Once | INTRAMUSCULAR | Status: AC
  Administered 2017-11-02: 80 mg via INTRAMUSCULAR

## 2017-11-02 MED ORDER — ONDANSETRON 4 MG PO TBDP
4.0000 mg | ORAL_TABLET | Freq: Once | ORAL | Status: AC
  Administered 2017-11-02: 4 mg via ORAL

## 2017-11-02 NOTE — Patient Instructions (Addendum)
I am increasing the meclizine to 25mg  three times per day.  This will make you sleepy. The zofran is for nausea as needed. I am starting you on an antibiotic as well.  Please take as prescribed.  Let's see you back in 1 week for recheck.   The steroid injection is to help open your sinuses.      Eustachian Tube Dysfunction The eustachian tube connects the middle ear to the back of the nose. It regulates air pressure in the middle ear by allowing air to move between the ear and nose. It also helps to drain fluid from the middle ear space. When the eustachian tube does not function properly, air pressure, fluid, or both can build up in the middle ear. Eustachian tube dysfunction can affect one or both ears. What are the causes? This condition happens when the eustachian tube becomes blocked or cannot open normally. This may result from:  Ear infections.  Colds and other upper respiratory infections.  Allergies.  Irritation, such as from cigarette smoke or acid from the stomach coming up into the esophagus (gastroesophageal reflux).  Sudden changes in air pressure, such as from descending in an airplane.  Abnormal growths in the nose or throat, such as nasal polyps, tumors, or enlarged tissue at the back of the throat (adenoids).  What increases the risk? This condition may be more likely to develop in people who smoke and people who are overweight. Eustachian tube dysfunction may also be more likely to develop in children, especially children who have:  Certain birth defects of the mouth, such as cleft palate.  Large tonsils and adenoids.  What are the signs or symptoms? Symptoms of this condition may include:  A feeling of fullness in the ear.  Ear pain.  Clicking or popping noises in the ear.  Ringing in the ear.  Hearing loss.  Loss of balance.  Symptoms may get worse when the air pressure around you changes, such as when you travel to an area of high elevation or fly on  an airplane. How is this diagnosed? This condition may be diagnosed based on:  Your symptoms.  A physical exam of your ear, nose, and throat.  Tests, such as those that measure: ? The movement of your eardrum (tympanogram). ? Your hearing (audiometry).  How is this treated? Treatment depends on the cause and severity of your condition. If your symptoms are mild, you may be able to relieve your symptoms by moving air into ("popping") your ears. If you have symptoms of fluid in your ears, treatment may include:  Decongestants.  Antihistamines.  Nasal sprays or ear drops that contain medicines that reduce swelling (steroids).  In some cases, you may need to have a procedure to drain the fluid in your eardrum (myringotomy). In this procedure, a small tube is placed in the eardrum to:  Drain the fluid.  Restore the air in the middle ear space.  Follow these instructions at home:  Take over-the-counter and prescription medicines only as told by your health care provider.  Use techniques to help pop your ears as recommended by your health care provider. These may include: ? Chewing gum. ? Yawning. ? Frequent, forceful swallowing. ? Closing your mouth, holding your nose closed, and gently blowing as if you are trying to blow air out of your nose.  Do not do any of the following until your health care provider approves: ? Travel to high altitudes. ? Fly in airplanes. ? Work in a  pressurized cabin or room. ? Scuba dive.  Keep your ears dry. Dry your ears completely after showering or bathing.  Do not smoke.  Keep all follow-up visits as told by your health care provider. This is important. Contact a health care provider if:  Your symptoms do not go away after treatment.  Your symptoms come back after treatment.  You are unable to pop your ears.  You have: ? A fever. ? Pain in your ear. ? Pain in your head or neck. ? Fluid draining from your ear.  Your hearing  suddenly changes.  You become very dizzy.  You lose your balance. This information is not intended to replace advice given to you by your health care provider. Make sure you discuss any questions you have with your health care provider. Document Released: 12/28/2015 Document Revised: 05/08/2016 Document Reviewed: 12/20/2014 Elsevier Interactive Patient Education  2018 [employer]   IF you received an x-ray today, you will receive an invoice from [facility]. Please contact [facility] at [phone] with questions or concerns regarding your invoice.   IF you received labwork today, you will receive an invoice from [facility]. Please contact [facility] at [phone] with questions or concerns regarding your invoice.   Our billing staff will not be able to assist you with questions regarding bills from these companies.  You will be contacted with the lab results as soon as they are available. The fastest way to get your results is to activate your My Chart account. Instructions are located on the last page of this paperwork. If you have not heard from us regarding the results in 2 weeks, please contact this office.

## 2017-11-02 NOTE — Progress Notes (Signed)
[facility] [address], [place] [address] [phone]  Date:  11/02/2017   Name:  Amanda Kemp   DOB:  [birth year]   MRN:  [id number]  PCP:  Patient, No Pcp Per    History of Present Illness:  Amanda Kemp is a 66 y.o. female patient who presents to PCP with  Chief Complaint  Patient presents with  . Dizziness    pt was seen/10/28/17 at [place]. pt states she still feels dizzy    Patient is here today with report of continued dizziness over about 2 weeks. Patient was seen 5 days ago for complaint of dizziness at the ED.  Labs were untelling, but she did have ekg with qt prolongation.  CT scan of head negative for acute findings, but did note sinusitis acutely.  She was given flonase and 12.5 bid prn.  She reports that she continues to feel like the room is spinning.  She has had nausea, fatigue, some weakness.  She denies extremity weakness, chest pains, palpitations, blurry vision or diplopia.  She has no emesis.  no blood or black stool.  She notes some congestion, but has had no facial pain, but does note some head pain.  She has no fever, or ear discomfort.     Patient Active Problem List   Diagnosis Date Noted  . Sinus problem 04/15/2016  . Environmental and seasonal allergies 05/08/2015    Past Medical History:  Diagnosis Date  . Allergy   . Hypertension   . Thyroid disease     Past Surgical History:  Procedure Laterality Date  . ABDOMINAL HYSTERECTOMY      Social History   Tobacco Use  . Smoking status: Never Smoker  . Smokeless tobacco: Never Used  Substance Use Topics  . Alcohol use: No    Alcohol/week: 0.0 oz  . Drug use: No    Family History  Problem Relation Age of Onset  . Hyperlipidemia Mother   . Hypertension Mother   . Cancer Mother   . Cancer Brother        prostate cancer  . Cancer Sister        kidney cancer  . Cancer Sister        lung cancer    No Known Allergies  Medication list has been  reviewed and updated.  Current Outpatient Medications on File Prior to Visit  Medication Sig Dispense Refill  . famotidine (PEPCID) 20 MG tablet Take 1 tablet (20 mg total) by mouth at bedtime as needed for heartburn or indigestion. 30 tablet 1  . fluticasone (FLONASE) 50 MCG/ACT nasal spray Place 1 spray daily into both nostrils. 16 g 2  . levothyroxine (SYNTHROID, LEVOTHROID) 100 MCG tablet Take 1 tablet (100 mcg total) by mouth daily. Please stop taking 88 mcg daily and take this instead. 180 tablet 1  . meclizine (ANTIVERT) 12.5 MG tablet Take 1 tablet (12.5 mg total) 2 (two) times daily as needed by mouth for dizziness. 10 tablet 0  . rosuvastatin (CRESTOR) 10 MG tablet Take 0.5-1 tablets (5-10 mg total) by mouth daily. Take 1/2 tab for one week then start taking the whole tab. (Patient taking differently: Take 10 mg daily by mouth. ) 180 tablet 1   No current facility-administered medications on file prior to visit.     ROS ROS otherwise unremarkable unless listed above.  Physical Examination: BP (!) 142/86   Pulse 83   Temp 98.4 F (36.9 C) (Oral)  Resp 16   Ht 5\' 5"  (1.651 m)   Wt 180 lb (81.6 kg)   SpO2 98%   BMI 29.95 kg/m  Ideal Body Weight: Weight in (lb) to have BMI = 25: 149.9  Physical Exam  Constitutional: She is oriented to person, place, and time. She appears well-developed and well-nourished. No distress.  HENT:  Head: Normocephalic and atraumatic.  Right Ear: Tympanic membrane, external ear and ear canal normal.  Left Ear: Tympanic membrane, external ear and ear canal normal.  Nose: Mucosal edema and rhinorrhea present. Right sinus exhibits no maxillary sinus tenderness and no frontal sinus tenderness. Left sinus exhibits no maxillary sinus tenderness and no frontal sinus tenderness.  Mouth/Throat: No uvula swelling. No oropharyngeal exudate, posterior oropharyngeal edema or posterior oropharyngeal erythema.  Eyes: Conjunctivae and EOM are normal. Pupils are  equal, round, and reactive to light.  Cardiovascular: Normal rate and regular rhythm. Exam reveals no gallop, no distant heart sounds and no friction rub.  No murmur heard. Pulmonary/Chest: Effort normal. No respiratory distress. She has no decreased breath sounds. She has no wheezes. She has no rhonchi.  Lymphadenopathy:       Head (right side): No submandibular, no tonsillar, no preauricular and no posterior auricular adenopathy present.       Head (left side): No submandibular, no tonsillar, no preauricular and no posterior auricular adenopathy present.  Neurological: She is alert and oriented to person, place, and time. No cranial nerve deficit. She displays a negative Romberg sign. Coordination normal.  Grip strength normal.   Nystagmus appreciated with dix hallpike.  More prominent on her left side. Symptoms heightened with maneuvering.    Skin: She is not diaphoretic.  Psychiatric: She has a normal mood and affect. Her behavior is normal.     Assessment and Plan: Amanda Kemp is a 66 y.o. female who is here today or cc of  Chief Complaint  Patient presents with  . Dizziness    pt was seen/10/28/17 at [place]. pt states she still feels dizzy    --vertigo is very prominent and perhaps could be treated more intensely.  I have advised for her to increase the meclizine at 25 mg tid. Given zofran for nausea Given steroid injection to improve the sinusitis, which likely is causing some etd and onset of symptoms.   Given abx as this time. Discussed alarming symptoms to warrant immediate return. Benign paroxysmal positional vertigo due to bilateral vestibular disorder - Plan: Basic metabolic panel, methylPREDNISolone acetate (DEPO-MEDROL) injection 80 mg, ondansetron (ZOFRAN-ODT) disintegrating tablet 4 mg, doxycycline (VIBRAMYCIN) 100 MG capsule, ondansetron (ZOFRAN ODT) 4 MG disintegrating tablet, meclizine (ANTIVERT) 25 MG tablet  Hypothyroidism, unspecified type - Plan:  TSH  Dizziness - Plan: EKG 12-Lead  Dysfunction of both eustachian tubes - Plan: methylPREDNISolone acetate (DEPO-MEDROL) injection 80 mg, ondansetron (ZOFRAN-ODT) disintegrating tablet 4 mg  Trena PlattStephanie English, PA-C [facility] 11/25/20186:55 PM

## 2017-11-03 LAB — BASIC METABOLIC PANEL
BUN/Creatinine Ratio: 8 — ABNORMAL LOW (ref 12–28)
BUN: 7 mg/dL — ABNORMAL LOW (ref 8–27)
CALCIUM: 10 mg/dL (ref 8.7–10.3)
CO2: 22 mmol/L (ref 20–29)
CREATININE: 0.91 mg/dL (ref 0.57–1.00)
Chloride: 106 mmol/L (ref 96–106)
GFR calc Af Amer: 77 mL/min/{1.73_m2} (ref >59)
GFR calc non Af Amer: 66 mL/min/{1.73_m2} (ref >59)
GLUCOSE: 99 mg/dL (ref 65–99)
Potassium: 4.5 mmol/L (ref 3.5–5.2)
Sodium: 145 mmol/L — ABNORMAL HIGH (ref 134–144)

## 2017-11-03 LAB — TSH: TSH: 3.26 u[IU]/mL (ref 0.450–4.500)

## 2017-11-03 MED ORDER — ONDANSETRON 4 MG PO TBDP: 4.0000 mg | ORAL_TABLET | Freq: Three times a day (TID) | ORAL | 0 refills | Status: DC | PRN

## 2017-11-03 MED ORDER — MECLIZINE HCL 25 MG PO TABS: 25.0000 mg | ORAL_TABLET | Freq: Three times a day (TID) | ORAL | 0 refills | Status: DC | PRN

## 2017-11-09 ENCOUNTER — Other Ambulatory Visit: Payer: Self-pay

## 2017-11-09 ENCOUNTER — Encounter: Payer: Self-pay | Admitting: Physician Assistant

## 2017-11-09 ENCOUNTER — Ambulatory Visit (INDEPENDENT_AMBULATORY_CARE_PROVIDER_SITE_OTHER): Payer: BLUE CROSS/BLUE SHIELD | Admitting: Physician Assistant

## 2017-11-09 VITALS — BP 134/88 | HR 92 | Temp 98.1°F | Resp 16 | Ht 65.0 in | Wt 183.0 lb

## 2017-11-09 DIAGNOSIS — H8113 Benign paroxysmal vertigo, bilateral: Secondary | ICD-10-CM

## 2017-11-09 DIAGNOSIS — R42 Dizziness and giddiness: Secondary | ICD-10-CM | POA: Diagnosis not present

## 2017-11-09 NOTE — Progress Notes (Signed)
[facility] [address], [place] [address] [phone]  Date:  11/09/2017   Name:  Amanda Kemp   DOB:  May 28, 1951   MRN:  [id number]  PCP:  Patient, No Pcp Per    History of Present Illness:  Amanda Kemp is a 66 y.o. female patient who presents to PCP with  Chief Complaint  Patient presents with  . Follow-up    vertigo/ pt feeling better     Patient is here for follow up of vertigo.  She has had these symptoms for the last 2 weeks.  Initial ED visit with full work up revealed qt prolongation on ekg, and CT of head: with acute sinusitis.  Given meclizine 12.5 bid and flonase.  After continuted vertigo symptoms, she returned here 6 days ago.  She was given an injection of depo medrol, zofran (nausea), and increased meclizine to 25 mg tid, and doxycycline.  She reports that she has noted much better symptomatically.  The vertigo is very minimally intermittent.  She is taking the medications compliantly.  She noticed improvement considerably with the depo-medrol.   She has not been able to work due to her symptoms.  She states that she feels ready come tomorrow.   Patient Active Problem List   Diagnosis Date Noted  . Sinus problem 04/15/2016  . Environmental and seasonal allergies 05/08/2015    Past Medical History:  Diagnosis Date  . Allergy   . Hypertension   . Thyroid disease     Past Surgical History:  Procedure Laterality Date  . ABDOMINAL HYSTERECTOMY      Social History   Tobacco Use  . Smoking status: Never Smoker  . Smokeless tobacco: Never Used  Substance Use Topics  . Alcohol use: No    Alcohol/week: 0.0 oz  . Drug use: No    Family History  Problem Relation Age of Onset  . Hyperlipidemia Mother   . Hypertension Mother   . Cancer Mother   . Cancer Brother        prostate cancer  . Cancer Sister        kidney cancer  . Cancer Sister        lung cancer    No Known Allergies  Medication list has been reviewed and  updated.  Current Outpatient Medications on File Prior to Visit  Medication Sig Dispense Refill  . famotidine (PEPCID) 20 MG tablet Take 1 tablet (20 mg total) by mouth at bedtime as needed for heartburn or indigestion. 30 tablet 1  . fluticasone (FLONASE) 50 MCG/ACT nasal spray Place 1 spray daily into both nostrils. 16 g 2  . levothyroxine (SYNTHROID, LEVOTHROID) 100 MCG tablet Take 1 tablet (100 mcg total) by mouth daily. Please stop taking 88 mcg daily and take this instead. 180 tablet 1  . meclizine (ANTIVERT) 25 MG tablet Take 1 tablet (25 mg total) by mouth 3 (three) times daily as needed for dizziness. 30 tablet 0  . ondansetron (ZOFRAN ODT) 4 MG disintegrating tablet Take 1 tablet (4 mg total) by mouth every 8 (eight) hours as needed for nausea or vomiting. 20 tablet 0  . rosuvastatin (CRESTOR) 10 MG tablet Take 0.5-1 tablets (5-10 mg total) by mouth daily. Take 1/2 tab for one week then start taking the whole tab. (Patient taking differently: Take 10 mg daily by mouth. ) 180 tablet 1  . doxycycline (VIBRAMYCIN) 100 MG capsule Take 1 capsule (100 mg total) 2 (two) times daily for 7  days by mouth. (Patient not taking: Reported on 11/09/2017) 14 capsule 0   No current facility-administered medications on file prior to visit.     ROS ROS otherwise unremarkable unless listed above.  Physical Examination: BP (!) 146/86   Pulse 92   Temp 98.1 F (36.7 C) (Oral)   Resp 16   Ht 5\' 5"  (1.651 m)   Wt 183 lb (83 kg)   SpO2 98%   BMI 30.45 kg/m  Ideal Body Weight: Weight in (lb) to have BMI = 25: 149.9  Physical Exam  Constitutional: She is oriented to person, place, and time. She appears well-developed and well-nourished. No distress.  HENT:  Head: Normocephalic and atraumatic.  Right Ear: Tympanic membrane, external ear and ear canal normal.  Left Ear: Tympanic membrane, external ear and ear canal normal.  Nose: No mucosal edema or rhinorrhea.  Mouth/Throat: No uvula swelling. No  oropharyngeal exudate, posterior oropharyngeal edema or posterior oropharyngeal erythema.  Eyes: Conjunctivae and EOM are normal. Pupils are equal, round, and reactive to light.  Cardiovascular: Normal rate and regular rhythm. Exam reveals no gallop, no distant heart sounds and no friction rub.  No murmur heard. Pulmonary/Chest: Effort normal. No respiratory distress. She has no decreased breath sounds. She has no wheezes. She has no rhonchi.  Lymphadenopathy:       Head (right side): No submandibular, no tonsillar, no preauricular and no posterior auricular adenopathy present.       Head (left side): No submandibular, no tonsillar, no preauricular and no posterior auricular adenopathy present.  Neurological: She is alert and oriented to person, place, and time. No cranial nerve deficit. Gait normal.  Skin: She is not diaphoretic.  Psychiatric: She has a normal mood and affect. Her behavior is normal.     Assessment and Plan: Amanda Kemp is a 66 y.o. female who is here today for cc of  Chief Complaint  Patient presents with  . Follow-up    vertigo/ pt feeling better  rtc as needed. Given letter for work, but she may return added paperwork for her temporary disability. Given epley maneuver information to review today.   Benign paroxysmal positional vertigo due to bilateral vestibular disorder  Dizziness  Trena PlattStephanie Lusia Greis, PA-C [facility] 11/26/201810:55 AM

## 2017-11-09 NOTE — Patient Instructions (Addendum)
Please review these instructions.  This may also help with your vertigo.  It may be best to do with a family member.   If you need any added paperwork, please have them fax, or bring to our offices.   How to Perform the Epley Maneuver The Epley maneuver is an exercise that relieves symptoms of vertigo. Vertigo is the feeling that you or your surroundings are moving when they are not. When you feel vertigo, you may feel like the room is spinning and have trouble walking. Dizziness is a little different than vertigo. When you are dizzy, you may feel unsteady or light-headed. You can do this maneuver at home whenever you have symptoms of vertigo. You can do it up to 3 times a day until your symptoms go away. Even though the Epley maneuver may relieve your vertigo for a few weeks, it is possible that your symptoms will return. This maneuver relieves vertigo, but it does not relieve dizziness. What are the risks? If it is done correctly, the Epley maneuver is considered safe. Sometimes it can lead to dizziness or nausea that goes away after a short time. If you develop other symptoms, such as changes in vision, weakness, or numbness, stop doing the maneuver and call your health care provider. How to perform the Epley maneuver 1. Sit on the edge of a bed or table with your back straight and your legs extended or hanging over the edge of the bed or table. 2. Turn your head halfway toward the affected ear or side. 3. Lie backward quickly with your head turned until you are lying flat on your back. You may want to position a pillow under your shoulders. 4. Hold this position for 30 seconds. You may experience an attack of vertigo. This is normal. 5. Turn your head to the opposite direction until your unaffected ear is facing the floor. 6. Hold this position for 30 seconds. You may experience an attack of vertigo. This is normal. Hold this position until the vertigo stops. 7. Turn your whole body to the same  side as your head. Hold for another 30 seconds. 8. Sit back up. You can repeat this exercise up to 3 times a day. Follow these instructions at home:  After doing the Epley maneuver, you can return to your normal activities.  Ask your health care provider if there is anything you should do at home to prevent vertigo. He or she may recommend that you: ? Keep your head raised (elevated) with two or more pillows while you sleep. ? Do not sleep on the side of your affected ear. ? Get up slowly from bed. ? Avoid sudden movements during the day. ? Avoid extreme head movement, like looking up or bending over. Contact a health care provider if:  Your vertigo gets worse.  You have other symptoms, including: ? Nausea. ? Vomiting. ? Headache. Get help right away if:  You have vision changes.  You have a severe or worsening headache or neck pain.  You cannot stop vomiting.  You have new numbness or weakness in any part of your body. Summary  Vertigo is the feeling that you or your surroundings are moving when they are not.  The Epley maneuver is an exercise that relieves symptoms of vertigo.  If the Epley maneuver is done correctly, it is considered safe. You can do it up to 3 times a day. This information is not intended to replace advice given to you by your health care  provider. Make sure you discuss any questions you have with your health care provider. Document Released: 12/06/2013 Document Revised: 10/21/2016 Document Reviewed: 10/21/2016 Elsevier Interactive Patient Education  2017 [employer]     IF you received an x-ray today, you will receive an invoice from [facility]. Please contact [facility] at [phone] with questions or concerns regarding your invoice.   IF you received labwork today, you will receive an invoice from [facility]. Please contact [facility] at [phone] with questions or concerns regarding your invoice.   Our billing staff will  not be able to assist you with questions regarding bills from these companies.  You will be contacted with the lab results as soon as they are available. The fastest way to get your results is to activate your My Chart account. Instructions are located on the last page of this paperwork. If you have not heard from us regarding the results in 2 weeks, please contact this office.

## 2017-11-16 ENCOUNTER — Encounter: Payer: Self-pay | Admitting: Physician Assistant

## 2017-11-16 ENCOUNTER — Ambulatory Visit: Payer: BLUE CROSS/BLUE SHIELD | Admitting: Physician Assistant

## 2017-11-16 ENCOUNTER — Other Ambulatory Visit: Payer: Self-pay

## 2017-11-16 VITALS — BP 124/76 | HR 85 | Temp 98.0°F | Resp 16 | Ht 65.0 in | Wt 188.2 lb

## 2017-11-16 DIAGNOSIS — Z23 Encounter for immunization: Secondary | ICD-10-CM | POA: Diagnosis not present

## 2017-11-16 DIAGNOSIS — R7309 Other abnormal glucose: Secondary | ICD-10-CM

## 2017-11-16 NOTE — Patient Instructions (Signed)
     IF you received an x-ray today, you will receive an invoice from [facility]. Please contact [facility] at [phone] with questions or concerns regarding your invoice.   IF you received labwork today, you will receive an invoice from [facility]. Please contact [facility] at [phone] with questions or concerns regarding your invoice.   Our billing staff will not be able to assist you with questions regarding bills from these companies.  You will be contacted with the lab results as soon as they are available. The fastest way to get your results is to activate your My Chart account. Instructions are located on the last page of this paperwork. If you have not heard from us regarding the results in 2 weeks, please contact this office.    We recommend that you schedule a mammogram for breast cancer screening. Typically, you do not need a referral to do this. Please contact a local imaging center to schedule your mammogram.  [facility] - [phone]  *ask for the Radiology Department [facility]) - [phone] or [phone]  [facility] - [phone] [facility] - [phone] [facility] - [phone]  *ask for the Radiology Department [facility] - [phone]  *ask for the Radiology Department [facility] - [phone]  *ask for the Mammography Department [facility] - [phone] 

## 2017-11-16 NOTE — Progress Notes (Signed)
11/16/2017 4:31 PM   DOB: 21-Feb-1951 / MRN: [id number]  SUBJECTIVE:  Amanda Kemp is a 66 y.o. female presenting for recheck of dizziness.  SHe feels well today. Was diagnosed with vertigo of vestibular origin and this has resolved. No dizziness with getting out of bed.    Immunization History  Administered Date(s) Administered  . Influenza,inj,Quad PF,6+ Mos 08/15/2017  . Influenza-Unspecified 09/15/2016  . Pneumococcal Conjugate-13 11/16/2017  . Tdap 05/21/2015   Adult vaccines due  Topic Date Due  . TETANUS/TDAP  05/20/2025     She has No Known Allergies.   She  has a past medical history of Allergy, Hypertension, and Thyroid disease.    She  reports that  has never smoked. she has never used smokeless tobacco. She reports that she does not drink alcohol or use drugs. She  reports that she currently engages in sexual activity. The patient  has a past surgical history that includes Abdominal hysterectomy.  Her family history includes Cancer in her brother, mother, sister, and sister; Hyperlipidemia in her mother; Hypertension in her mother.  Review of Systems  Constitutional: Negative for chills, diaphoresis and fever.  Eyes: Negative.   Respiratory: Negative for cough, hemoptysis, sputum production, shortness of breath and wheezing.   Cardiovascular: Negative for chest pain, orthopnea and leg swelling.  Gastrointestinal: Negative for nausea.  Skin: Negative for rash.  Neurological: Negative for dizziness, sensory change, speech change, focal weakness and headaches.    The problem list and medications were reviewed and updated by myself where necessary and exist elsewhere in the encounter.   OBJECTIVE:  BP 124/76 (BP Location: Right Arm, Patient Position: Sitting, Cuff Size: Large)   Pulse 85   Temp 98 F (36.7 C) (Oral)   Resp 16   Ht 5\' 5"  (1.651 m)   Wt 188 lb 3.2 oz (85.4 kg)   SpO2 98%   BMI 31.32 kg/m   BP Readings from Last 3 Encounters:    11/16/17 124/76  11/09/17 134/88  11/02/17 (!) 142/86   Physical Exam  Constitutional: She is oriented to person, place, and time. She is active.  Non-toxic appearance.  HENT:  Right Ear: Hearing, tympanic membrane, external ear and ear canal normal.  Left Ear: Hearing, tympanic membrane, external ear and ear canal normal.  Nose: Nose normal. Right sinus exhibits no maxillary sinus tenderness and no frontal sinus tenderness. Left sinus exhibits no maxillary sinus tenderness and no frontal sinus tenderness.  Mouth/Throat: Uvula is midline, oropharynx is clear and moist and mucous membranes are normal. Mucous membranes are not dry. No oropharyngeal exudate, posterior oropharyngeal edema or tonsillar abscesses.  Eyes: EOM are normal. Pupils are equal, round, and reactive to light.  Cardiovascular: Normal rate, regular rhythm, S1 normal, S2 normal, normal heart sounds and intact distal pulses. Exam reveals no gallop, no friction rub and no decreased pulses.  No murmur heard. Pulmonary/Chest: Effort normal. No stridor. No tachypnea. No respiratory distress. She has no wheezes. She has no rales.  Abdominal: She exhibits no distension.  Musculoskeletal: She exhibits no edema.  Lymphadenopathy:       Head (right side): No submandibular and no tonsillar adenopathy present.       Head (left side): No submandibular and no tonsillar adenopathy present.    She has no cervical adenopathy.  Neurological: She is alert and oriented to person, place, and time. She has normal strength and normal reflexes. She is not disoriented. She displays no atrophy. No cranial nerve  deficit or sensory deficit. She exhibits normal muscle tone. Coordination and gait normal.  Skin: Skin is warm and dry. She is not diaphoretic. No pallor.  Psychiatric: Her behavior is normal.    Lab Results  Component Value Date   WBC 8.4 10/28/2017   HGB 13.3 10/28/2017   HCT 40.7 10/28/2017   MCV 81.1 10/28/2017   PLT 287 10/28/2017     Lab Results  Component Value Date   CREATININE 0.91 11/02/2017   BUN 7 (L) 11/02/2017   NA 145 (H) 11/02/2017   K 4.5 11/02/2017   CL 106 11/02/2017   CO2 22 11/02/2017    Lab Results  Component Value Date   ALT 15 07/29/2017   AST 25 07/29/2017   ALKPHOS 70 07/29/2017   BILITOT <0.2 07/29/2017    Lab Results  Component Value Date   TSH 3.260 11/02/2017    Lab Results  Component Value Date   HGBA1C 5.9 (H) 03/02/2017    Lab Results  Component Value Date   CHOL 342 (H) 03/02/2017   HDL 46 03/02/2017   LDLCALC 267 (H) 03/02/2017   TRIG 147 03/02/2017   CHOLHDL 7.4 (H) 03/02/2017     No results found for this or any previous visit (from the past 72 hour(s)).  No results found.  ASSESSMENT AND PLAN:  Amanda Kemp was seen today for follow-up.  Diagnoses and all orders for this visit:  Elevated hemoglobin A1c measurement: Given recent finding on CT scan consistnet with sinusitis and improvement with doxy will have a low threshold to start Augmentin if the symptoms return.  -     Hemoglobin A1c  Need for prophylactic vaccination against Streptococcus pneumoniae (pneumococcus) -     Pneumococcal conjugate vaccine 13-valent IM    The patient is advised to call or return to clinic if she does not see an improvement in symptoms, or to seek the care of the closest emergency department if she worsens with the above plan.   Deliah BostonMichael Yared Barefoot, MHS, PA-C [facility] 11/16/2017 4:31 PM

## 2017-11-17 LAB — HEMOGLOBIN A1C
ESTIMATED AVERAGE GLUCOSE: 128 mg/dL
HEMOGLOBIN A1C: 6.1 % — AB (ref 4.8–5.6)

## 2018-02-03 ENCOUNTER — Encounter: Payer: Self-pay | Admitting: Physician Assistant

## 2018-02-03 ENCOUNTER — Other Ambulatory Visit: Payer: Self-pay

## 2018-02-03 ENCOUNTER — Ambulatory Visit: Payer: BLUE CROSS/BLUE SHIELD | Admitting: Physician Assistant

## 2018-02-03 VITALS — BP 134/84 | HR 76 | Temp 98.0°F | Resp 16 | Ht 65.0 in | Wt 185.8 lb

## 2018-02-03 DIAGNOSIS — R1013 Epigastric pain: Secondary | ICD-10-CM | POA: Diagnosis not present

## 2018-02-03 DIAGNOSIS — E785 Hyperlipidemia, unspecified: Secondary | ICD-10-CM

## 2018-02-03 DIAGNOSIS — E039 Hypothyroidism, unspecified: Secondary | ICD-10-CM

## 2018-02-03 LAB — POCT CBC
Granulocyte percent: 56.4 %G (ref 37–80)
HEMATOCRIT: 39.3 % (ref 37.7–47.9)
Hemoglobin: 12.7 g/dL (ref 12.2–16.2)
Lymph, poc: 2.4 (ref 0.6–3.4)
MCH, POC: 25.7 pg — AB (ref 27–31.2)
MCHC: 32.4 g/dL (ref 31.8–35.4)
MCV: 79.1 fL — AB (ref 80–97)
MID (cbc): 0.1 (ref 0–0.9)
MPV: 7.8 fL (ref 0–99.8)
POC GRANULOCYTE: 3.3 (ref 2–6.9)
POC LYMPH %: 41.2 % (ref 10–50)
POC MID %: 2.4 % (ref 0–12)
Platelet Count, POC: 313 10*3/uL (ref 142–424)
RBC: 4.96 M/uL (ref 4.04–5.48)
RDW, POC: 14.1 %
WBC: 5.9 10*3/uL (ref 4.6–10.2)

## 2018-02-03 LAB — POCT URINALYSIS DIP (MANUAL ENTRY)
BILIRUBIN UA: NEGATIVE
GLUCOSE UA: NEGATIVE mg/dL
NITRITE UA: NEGATIVE
PH UA: 6 (ref 5.0–8.0)
Protein Ur, POC: NEGATIVE mg/dL
Spec Grav, UA: 1.015 (ref 1.010–1.025)
UROBILINOGEN UA: 0.2 U/dL

## 2018-02-03 LAB — GLUCOSE, POCT (MANUAL RESULT ENTRY): POC Glucose: 94 mg/dl (ref 70–99)

## 2018-02-03 MED ORDER — ONDANSETRON HCL 4 MG PO TABS: 4.0000 mg | ORAL_TABLET | Freq: Three times a day (TID) | ORAL | 1 refills | Status: DC | PRN

## 2018-02-03 NOTE — Patient Instructions (Addendum)
     IF you received an x-ray today, you will receive an invoice from [facility]. Please contact [facility] at [phone] with questions or concerns regarding your invoice.   IF you received labwork today, you will receive an invoice from [facility]. Please contact [facility] at [phone] with questions or concerns regarding your invoice.   Our billing staff will not be able to assist you with questions regarding bills from these companies.  You will be contacted with the lab results as soon as they are available. The fastest way to get your results is to activate your My Chart account. Instructions are located on the last page of this paperwork. If you have not heard from us regarding the results in 2 weeks, please contact this office.    Brat  Bland Diet A bland diet consists of foods that do not have a lot of fat or fiber. Foods without fat or fiber are easier for the body to digest. They are also less likely to irritate your mouth, throat, stomach, and other parts of your gastrointestinal tract. A bland diet is sometimes called a BRAT diet. What is my plan? Your health care provider or dietitian may recommend specific changes to your diet to prevent and treat your symptoms, such as:  Eating small meals often.  Cooking food until it is soft enough to chew easily.  Chewing your food well.  Drinking fluids slowly.  Not eating foods that are very spicy, sour, or fatty.  Not eating citrus fruits, such as oranges and grapefruit.  What do I need to know about this diet?  Eat a variety of foods from the bland diet food list.  Do not follow a bland diet longer than you have to.  Ask your health care provider whether you should take vitamins. What foods can I eat? Grains  Hot cereals, such as cream of wheat. Bread, crackers, or tortillas made from refined white flour. Rice. Vegetables Canned or cooked vegetables. Mashed or boiled potatoes. Fruits Bananas.  Applesauce. Other types of cooked or canned fruit with the skin and seeds removed, such as canned peaches or pears. Meats and Other Protein Sources Scrambled eggs. Creamy peanut butter or other nut butters. Lean, well-cooked meats, such as chicken or fish. Tofu. Soups or broths. Dairy Low-fat dairy products, such as milk, cottage cheese, or yogurt. Beverages Water. Herbal tea. Apple juice. Sweets and Desserts Pudding. Custard. Fruit gelatin. Ice cream. Fats and Oils Mild salad dressings. Canola or olive oil. The items listed above may not be a complete list of allowed foods or beverages. Contact your dietitian for more options. What foods are not recommended? Foods and ingredients that are often not recommended include:  Spicy foods, such as hot sauce or salsa.  Fried foods.  Sour foods, such as pickled or fermented foods.  Raw vegetables or fruits, especially citrus or berries.  Caffeinated drinks.  Alcohol.  Strongly flavored seasonings or condiments.  The items listed above may not be a complete list of foods and beverages that are not allowed. Contact your dietitian for more information. This information is not intended to replace advice given to you by your health care provider. Make sure you discuss any questions you have with your health care provider. Document Released: 03/24/2016 Document Revised: 05/08/2016 Document Reviewed: 12/13/2014 Elsevier Interactive Patient Education  2018 [employer]

## 2018-02-03 NOTE — Progress Notes (Signed)
02/03/2018 11:33 AM   DOB: 07-02-1951 / MRN: [id number]  SUBJECTIVE:  Amanda Kemp is a 67 y.o. female presenting for nausea and epigastric pain that is worse with eating.  Symptoms have been present now for about a week.  Associates some radiation to the back.  She is not a drinker.  She is not getting worse or better.  She denies blood in the stool and continues to have normal bowel movements with normal color.  She denies dysuria and reports normal colored urine.  She has not tried anything for the pain.  She is status post abdominal hysterectomy.  She tells me she still has her gallbladder.  An ultrasound exist in the system from 2005 that does show some borderline common bile duct dilatation.  Most recent colonoscopy does show sigmoid diverticulosis.  She has No Known Allergies.   She  has a past medical history of Allergy, Hypertension, and Thyroid disease.    She  reports that  has never smoked. she has never used smokeless tobacco. She reports that she does not drink alcohol or use drugs. She  reports that she currently engages in sexual activity. The patient  has a past surgical history that includes Abdominal hysterectomy.  Her family history includes Cancer in her brother, mother, sister, and sister; Hyperlipidemia in her mother; Hypertension in her mother.  Review of Systems  Constitutional: Negative for chills, diaphoresis and fever.  Respiratory: Negative for cough, hemoptysis, sputum production, shortness of breath and wheezing.   Cardiovascular: Negative for chest pain, orthopnea and leg swelling.  Gastrointestinal: Negative for abdominal pain and nausea.  Genitourinary: Negative for dysuria, flank pain, frequency, hematuria and urgency.  Skin: Negative for rash.  Neurological: Negative for dizziness.    The problem list and medications were reviewed and updated by myself where necessary and exist elsewhere in the encounter.   OBJECTIVE:  BP 134/84 (BP Location:  Right Arm, Patient Position: Sitting, Cuff Size: Normal)   Pulse 76   Temp 98 F (36.7 C) (Oral)   Resp 16   Ht 5\' 5"  (1.651 m)   Wt 185 lb 12.8 oz (84.3 kg)   SpO2 98%   BMI 30.92 kg/m   Pulse Readings from Last 3 Encounters:  02/03/18 76  11/16/17 85  11/09/17 92   BP Readings from Last 3 Encounters:  02/03/18 134/84  11/16/17 124/76  11/09/17 134/88   Wt Readings from Last 3 Encounters:  02/03/18 185 lb 12.8 oz (84.3 kg)  11/16/17 188 lb 3.2 oz (85.4 kg)  11/09/17 183 lb (83 kg)     Physical Exam  Constitutional: She is active.  Non-toxic appearance.  Cardiovascular: Normal rate.  Pulmonary/Chest: Effort normal. No tachypnea.  Abdominal: Soft. Bowel sounds are normal. She exhibits no distension and no mass. There is tenderness (About the epigastrium). There is no rebound and no guarding.  Neurological: She is alert.  Skin: Skin is warm and dry. She is not diaphoretic. No pallor.    Lab Results  Component Value Date   WBC 5.9 02/03/2018   HGB 12.7 02/03/2018   HCT 39.3 02/03/2018   MCV 79.1 (A) 02/03/2018   PLT 287 10/28/2017    Lab Results  Component Value Date   CREATININE 0.91 11/02/2017   BUN 7 (L) 11/02/2017   NA 145 (H) 11/02/2017   K 4.5 11/02/2017   CL 106 11/02/2017   CO2 22 11/02/2017    Lab Results  Component Value Date   ALT  15 07/29/2017   AST 25 07/29/2017   ALKPHOS 70 07/29/2017   BILITOT <0.2 07/29/2017    Lab Results  Component Value Date   TSH 3.260 11/02/2017    Lab Results  Component Value Date   HGBA1C 6.1 (H) 11/16/2017    Lab Results  Component Value Date   CHOL 342 (H) 03/02/2017   HDL 46 03/02/2017   LDLCALC 267 (H) 03/02/2017   TRIG 147 03/02/2017   CHOLHDL 7.4 (H) 03/02/2017      Results for orders placed or performed in visit on 02/03/18 (from the past 72 hour(s))  POCT glucose (manual entry)     Status: None   Collection Time: 02/03/18  9:10 AM  Result Value Ref Range   POC Glucose 94 70 - 99 mg/dl   POCT CBC     Status: Abnormal   Collection Time: 02/03/18  9:12 AM  Result Value Ref Range   WBC 5.9 4.6 - 10.2 K/uL   Lymph, poc 2.4 0.6 - 3.4   POC LYMPH PERCENT 41.2 10 - 50 %L   MID (cbc) 0.1 0 - 0.9   POC MID % 2.4 0 - 12 %M   POC Granulocyte 3.3 2 - 6.9   Granulocyte percent 56.4 37 - 80 %G   RBC 4.96 4.04 - 5.48 M/uL   Hemoglobin 12.7 12.2 - 16.2 g/dL   HCT, POC 16.1 09.6 - 47.9 %   MCV 79.1 (A) 80 - 97 fL   MCH, POC 25.7 (A) 27 - 31.2 pg   MCHC 32.4 31.8 - 35.4 g/dL   RDW, POC 04.5 %   Platelet Count, POC 313 142 - 424 K/uL   MPV 7.8 0 - 99.8 fL  POCT urinalysis dipstick     Status: Abnormal   Collection Time: 02/03/18  9:26 AM  Result Value Ref Range   Color, UA yellow yellow   Clarity, UA clear clear   Glucose, UA negative negative mg/dL   Bilirubin, UA negative negative   Ketones, POC UA trace (5) (A) negative mg/dL   Spec Grav, UA 4.098 1.191 - 1.025   Blood, UA small (A) negative   pH, UA 6.0 5.0 - 8.0   Protein Ur, POC negative negative mg/dL   Urobilinogen, UA 0.2 0.2 or 1.0 E.U./dL   Nitrite, UA Negative Negative   Leukocytes, UA Small (1+) (A) Negative    No results found.  ASSESSMENT AND PLAN:  Lexie was seen today for abdominal pain.  Diagnoses and all orders for this visit:  Abdominal pain, epigastric: I have a high suspicion for choledocholithiasis.  She does not have a septic picture.  Think it is best to proceed with an image of the right upper quadrant.  I will order this is outpatient.  I prescribed Zofran for nausea and advising that she start a brat diet today in order to avoid fatty foods in the symptoms. -     POCT glycosylated hemoglobin (Hb A1C) -     POCT CBC -     POCT glucose (manual entry) -     POCT urinalysis dipstick -     Basic metabolic panel -     Hepatic function panel  Hypothyroidism, unspecified type -     TSH  Dyslipidemia -     Lipid Panel    The patient is advised to call or return to clinic if she does not  see an improvement in symptoms, or to seek the care of the closest  emergency department if she worsens with the above plan.   Deliah BostonMichael Eden Rho, MHS, PA-C Primary Care at [facility] 02/03/2018 11:33 AM

## 2018-02-04 LAB — URINE CULTURE

## 2018-02-04 LAB — LIPID PANEL
CHOL/HDL RATIO: 4.6 ratio — AB (ref 0.0–4.4)
CHOLESTEROL TOTAL: 211 mg/dL — AB (ref 100–199)
HDL: 46 mg/dL (ref >39)
LDL Calculated: 146 mg/dL — ABNORMAL HIGH (ref 0–99)
TRIGLYCERIDES: 95 mg/dL (ref 0–149)
VLDL CHOLESTEROL CAL: 19 mg/dL (ref 5–40)

## 2018-02-04 LAB — BASIC METABOLIC PANEL
BUN/Creatinine Ratio: 19 (ref 12–28)
BUN: 15 mg/dL (ref 8–27)
CALCIUM: 9.4 mg/dL (ref 8.7–10.3)
CO2: 22 mmol/L (ref 20–29)
CREATININE: 0.8 mg/dL (ref 0.57–1.00)
Chloride: 107 mmol/L — ABNORMAL HIGH (ref 96–106)
GFR calc Af Amer: 89 mL/min/{1.73_m2} (ref >59)
GFR calc non Af Amer: 78 mL/min/{1.73_m2} (ref >59)
GLUCOSE: 103 mg/dL — AB (ref 65–99)
POTASSIUM: 4.6 mmol/L (ref 3.5–5.2)
SODIUM: 143 mmol/L (ref 134–144)

## 2018-02-04 LAB — HEPATIC FUNCTION PANEL
ALBUMIN: 4.2 g/dL (ref 3.6–4.8)
ALT: 14 IU/L (ref 0–32)
AST: 19 IU/L (ref 0–40)
Alkaline Phosphatase: 71 IU/L (ref 39–117)
BILIRUBIN TOTAL: 0.2 mg/dL (ref 0.0–1.2)
Bilirubin, Direct: 0.08 mg/dL (ref 0.00–0.40)
TOTAL PROTEIN: 7.4 g/dL (ref 6.0–8.5)

## 2018-02-04 LAB — TSH: TSH: 1.22 u[IU]/mL (ref 0.450–4.500)

## 2018-02-04 LAB — LIPASE: LIPASE: 17 U/L (ref 14–72)

## 2018-02-04 LAB — HEMOGLOBIN A1C
ESTIMATED AVERAGE GLUCOSE: 126 mg/dL
HEMOGLOBIN A1C: 6 % — AB (ref 4.8–5.6)

## 2018-02-17 ENCOUNTER — Telehealth: Payer: Self-pay | Admitting: Physician Assistant

## 2018-02-17 NOTE — Telephone Encounter (Signed)
Called and left message to remind pt of their apt tomorrow. Advised of building number and time restrictions. °

## 2018-02-18 ENCOUNTER — Ambulatory Visit: Payer: BLUE CROSS/BLUE SHIELD | Admitting: Physician Assistant

## 2018-02-18 ENCOUNTER — Ambulatory Visit
Admission: RE | Admit: 2018-02-18 | Discharge: 2018-02-18 | Disposition: A | Discharge status: Home / Self Care | Payer: BLUE CROSS/BLUE SHIELD | Source: Ambulatory Visit | Attending: Physician Assistant | Admitting: Physician Assistant

## 2018-02-18 DIAGNOSIS — R1013 Epigastric pain: Secondary | ICD-10-CM

## 2018-02-25 ENCOUNTER — Telehealth: Payer: Self-pay

## 2018-02-25 DIAGNOSIS — R1013 Epigastric pain: Secondary | ICD-10-CM

## 2018-02-25 NOTE — Telephone Encounter (Signed)
Recent abdominal results below:  Notes recorded by Ofilia Neaslark, Michael L, PA-C on 02/19/2018 at 8:29 AM EST Please call patient and advise of negative result. If she is still having problems I need to get her in with Dr. Leone PayorGessner in GI. Deliah BostonMichael Clark, MS, PA-C 8:28 AM, 02/19/2018  Patient had been prescribed Zofran at 02/03/18 visit with Deliah BostonMichael Clark.   Phone call to patient. She states, "My stomach is hurting and bothering me. I'm trying to see what was what." and "I'm taking tylenol, ibuprofen, and aleve but they're not doing me any good".  She states she has not had an appetite. Eating apple sauce, Potato foods.   Reviewed ultrasound with patient, and provider recommendations. Reviewed bland diet (BRAT) recommended by Deliah BostonMichael Clark at last office visit in detail.   Referral to Dr. Leone PayorGessner pended.  Provider, any additional advice regarding what she can take for abdominal pain while she waits for her GI appointment? Please advise.

## 2018-02-25 NOTE — Telephone Encounter (Signed)
Copied from CRM [id number]. Topic: Inquiry >> Feb 24, 2018  4:46 PM Alexander BergeronBarksdale, Harvey B wrote: Reason for CRM: pt called and stated she hasnt heard back from anyone regarding the stomach pain, pt states she needs some help and would like to have something prescribed for her, contact pt to advise

## 2018-02-25 NOTE — Telephone Encounter (Signed)
Referral signed.  Thank you Lizzy. Deliah BostonMichael Meshulem Onorato, MS, PA-C 2:06 PM, 02/25/2018

## 2018-03-16 ENCOUNTER — Encounter: Payer: Self-pay | Admitting: Physician Assistant

## 2018-04-21 ENCOUNTER — Ambulatory Visit (INDEPENDENT_AMBULATORY_CARE_PROVIDER_SITE_OTHER): Payer: BLUE CROSS/BLUE SHIELD | Admitting: Physician Assistant

## 2018-04-21 ENCOUNTER — Encounter: Payer: Self-pay | Admitting: Physician Assistant

## 2018-04-21 VITALS — BP 138/78 | HR 91 | Temp 98.4°F | Resp 17 | Ht 65.0 in | Wt 178.0 lb

## 2018-04-21 DIAGNOSIS — J3089 Other allergic rhinitis: Secondary | ICD-10-CM

## 2018-04-21 DIAGNOSIS — E039 Hypothyroidism, unspecified: Secondary | ICD-10-CM

## 2018-04-21 DIAGNOSIS — R05 Cough: Secondary | ICD-10-CM

## 2018-04-21 DIAGNOSIS — E785 Hyperlipidemia, unspecified: Secondary | ICD-10-CM

## 2018-04-21 DIAGNOSIS — R0689 Other abnormalities of breathing: Secondary | ICD-10-CM

## 2018-04-21 DIAGNOSIS — R059 Cough, unspecified: Secondary | ICD-10-CM

## 2018-04-21 MED ORDER — ROSUVASTATIN CALCIUM 10 MG PO TABS: 10.0000 mg | ORAL_TABLET | Freq: Every day | ORAL | 3 refills | Status: AC

## 2018-04-21 MED ORDER — ALBUTEROL SULFATE (2.5 MG/3ML) 0.083% IN NEBU
2.5000 mg | INHALATION_SOLUTION | Freq: Once | RESPIRATORY_TRACT | Status: AC
  Administered 2018-04-21: 2.5 mg via RESPIRATORY_TRACT

## 2018-04-21 MED ORDER — PREDNISONE 50 MG PO TABS: ORAL_TABLET | ORAL | 0 refills | Status: DC

## 2018-04-21 MED ORDER — IPRATROPIUM BROMIDE 0.02 % IN SOLN
0.5000 mg | Freq: Once | RESPIRATORY_TRACT | Status: AC
  Administered 2018-04-21: 0.5 mg via RESPIRATORY_TRACT

## 2018-04-21 MED ORDER — AZITHROMYCIN 250 MG PO TABS: ORAL_TABLET | ORAL | 0 refills | Status: AC

## 2018-04-21 MED ORDER — FLUTICASONE FUROATE 27.5 MCG/SPRAY NA SUSP: 2.0000 | Freq: Every day | NASAL | 12 refills | Status: AC

## 2018-04-21 MED ORDER — LEVOTHYROXINE SODIUM 100 MCG PO TABS: 100.0000 ug | ORAL_TABLET | Freq: Every day | ORAL | 1 refills | Status: AC

## 2018-04-21 NOTE — Progress Notes (Signed)
04/23/2018 8:29 AM   DOB: [birth year] / MRN: [id number]  SUBJECTIVE:  Amanda Kemp is a 67 y.o. female presenting for multiple allergic symptoms for which she has a history of.  Denies fever and chills he denies teeth pain.  She feels she is getting worse.  He is not taking the Flonase as directed.  She has No Known Allergies.   She  has a past medical history of Allergy, Hypertension, and Thyroid disease.    She  reports that she has never smoked. She has never used smokeless tobacco. She reports that she does not drink alcohol or use drugs. She  reports that she currently engages in sexual activity. The patient  has a past surgical history that includes Abdominal hysterectomy.  Her family history includes Cancer in her brother, mother, sister, and sister; Hyperlipidemia in her mother; Hypertension in her mother.  Review of Systems  Constitutional: Negative for chills, diaphoresis and fever.  Eyes: Negative.   Respiratory: Negative for cough, hemoptysis, sputum production, shortness of breath and wheezing.   Cardiovascular: Negative for chest pain, orthopnea and leg swelling.  Gastrointestinal: Negative for abdominal pain, blood in stool, constipation, diarrhea, heartburn, melena, nausea and vomiting.  Genitourinary: Negative for dysuria, flank pain, frequency, hematuria and urgency.  Skin: Negative for rash.  Neurological: Negative for dizziness, sensory change, speech change, focal weakness and headaches.    The problem list and medications were reviewed and updated by myself where necessary and exist elsewhere in the encounter.   OBJECTIVE:  BP 138/78   Pulse 91   Temp 98.4 F (36.9 C) (Oral)   Resp 17   Ht  (1.651 m)   Wt 178 lb (80.7 kg)   SpO2 98%   BMI 29.62 kg/m   BP Readings from Last 3 Encounters:  04/21/18 138/78  02/03/18 134/84  11/16/17 124/76   Wt Readings from Last 3 Encounters:  04/21/18 178 lb (80.7 kg)  02/03/18 185 lb 12.8 oz (84.3 kg)   11/16/17 188 lb 3.2 oz (85.4 kg)     Lab Results  Component Value Date   WBC 5.9 02/03/2018   HGB 12.7 02/03/2018   HCT 39.3 02/03/2018   MCV 79.1 (A) 02/03/2018   PLT 287 10/28/2017    Lab Results  Component Value Date   CREATININE 0.80 02/03/2018   BUN 15 02/03/2018   NA 143 02/03/2018   K 4.6 02/03/2018   CL 107 (H) 02/03/2018   CO2 22 02/03/2018    Lab Results  Component Value Date   ALT 14 02/03/2018   AST 19 02/03/2018   ALKPHOS 71 02/03/2018   BILITOT 0.2 02/03/2018    Lab Results  Component Value Date   TSH 1.220 02/03/2018    Lab Results  Component Value Date   HGBA1C 6.0 (H) 02/03/2018    Lab Results  Component Value Date   CHOL 211 (H) 02/03/2018   HDL 46 02/03/2018   LDLCALC 146 (H) 02/03/2018   TRIG 95 02/03/2018   CHOLHDL 4.6 (H) 02/03/2018     Physical Exam  Constitutional: She is oriented to person, place, and time. She appears well-developed and well-nourished. No distress.  HENT:  Right Ear: Hearing, tympanic membrane, external ear and ear canal normal.  Left Ear: Hearing, tympanic membrane, external ear and ear canal normal.  Nose: Mucosal edema (With pale bluish hue.  Right greater than left.) present. Right sinus exhibits no maxillary sinus tenderness and no frontal sinus tenderness. Left sinus  exhibits no maxillary sinus tenderness and no frontal sinus tenderness.  Mouth/Throat: Uvula is midline, oropharynx is clear and moist and mucous membranes are normal. Mucous membranes are not dry. No oropharyngeal exudate, posterior oropharyngeal edema or tonsillar abscesses.  Eyes: Pupils are equal, round, and reactive to light. EOM are normal.  Cardiovascular: Normal rate, regular rhythm, S1 normal, S2 normal, normal heart sounds and intact distal pulses. Exam reveals no gallop, no friction rub and no decreased pulses.  No murmur heard. Pulmonary/Chest: Effort normal. She has no rales.  Abdominal: She exhibits no distension.    Musculoskeletal: Normal range of motion. She exhibits no edema.  Lymphadenopathy:       Head (right side): No submandibular and no tonsillar adenopathy present.       Head (left side): No submandibular and no tonsillar adenopathy present.    She has no cervical adenopathy.  Neurological: She is alert and oriented to person, place, and time. No cranial nerve deficit. Gait normal.  Skin: Skin is warm and dry. She is not diaphoretic.  Psychiatric: She has a normal mood and affect.  Vitals reviewed.   No results found for this or any previous visit (from the past 72 hour(s)).  No results found.  ASSESSMENT AND PLAN:  Brandi was seen today for sinusitis.  Diagnoses and all orders for this visit:  Dyslipidemia -     rosuvastatin (CRESTOR) 10 MG tablet; Take 1 tablet (10 mg total) by mouth daily.  Hypothyroidism, unspecified type -     levothyroxine (SYNTHROID, LEVOTHROID) 100 MCG tablet; Take 1 tablet (100 mcg total) by mouth daily. Please stop taking 88 mcg daily and take this instead.  Cough -     predniSONE (DELTASONE) 50 MG tablet; Take one tab daily for three days. -     azithromycin (ZITHROMAX) 250 MG tablet; Take 2 tabs PO x 1 dose, then 1 tab PO QD x 4 days  Adventitious breath sounds -     albuterol (PROVENTIL) (2.5 MG/3ML) 0.083% nebulizer solution 2.5 mg -     ipratropium (ATROVENT) nebulizer solution 0.5 mg -     predniSONE (DELTASONE) 50 MG tablet; Take one tab daily for three days. -     azithromycin (ZITHROMAX) 250 MG tablet; Take 2 tabs PO x 1 dose, then 1 tab PO QD x 4 days  Environmental and seasonal allergies -     fluticasone (FLONASE SENSIMIST) 27.5 MCG/SPRAY nasal spray; Place 2 sprays into the nose daily.    The patient is advised to call or return to clinic if she does not see an improvement in symptoms, or to seek the care of the closest emergency department if she worsens with the above plan.   [name], MHS, PA-C Primary Care at [facility] 04/23/2018 8:29 AM

## 2018-04-21 NOTE — Patient Instructions (Addendum)
Start the prednisone and azithromycin today.  Start the new flonase formulation today.  Come back if you need me.     IF you received an x-ray today, you will receive an invoice from [facility]. Please contact [facility] at [phone] with questions or concerns regarding your invoice.   IF you received labwork today, you will receive an invoice from [place]. Please contact [facility] at [phone] with questions or concerns regarding your invoice.   Our billing staff will not be able to assist you with questions regarding bills from these companies.  You will be contacted with the lab results as soon as they are available. The fastest way to get your results is to activate your My Chart account. Instructions are located on the last page of this paperwork. If you have not heard from [place] regarding the results in 2 weeks, please contact this office.

## 2018-04-28 ENCOUNTER — Encounter: Payer: Self-pay | Admitting: Physician Assistant

## 2018-05-07 IMAGING — CT CT HEAD W/O CM
4 series · 17 of 47 positions shown, 19 images · non-contrast
Comparison: None.

CLINICAL DATA: Dizziness 1 week.  Hypertension.

EXAM:
CT HEAD WITHOUT CONTRAST
TECHNIQUE: Contiguous axial images were obtained from the base of the skull
through the vertex without intravenous contrast.

[Series 3: head without · axial · non-contrast · 0.41mm/px · z∈[+1284,+1409]mm · 7 of 35 slices shown, 9 images]
[im 5/35  brain]
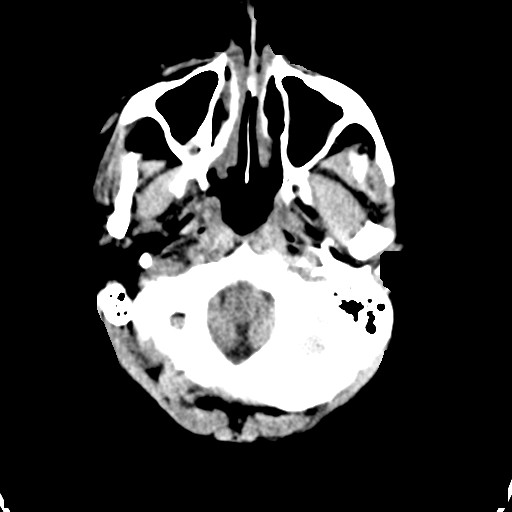
[im 5/35  bone]
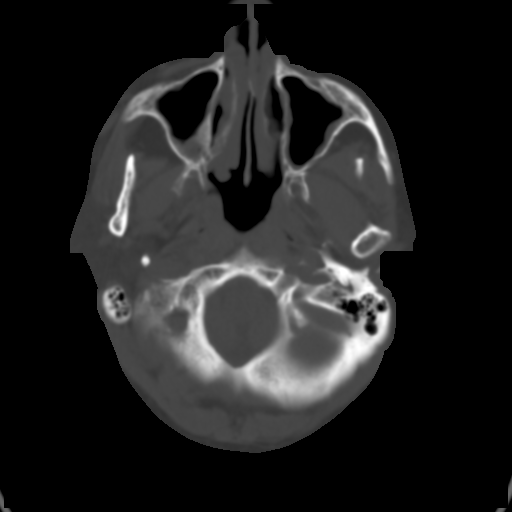
[im 9/35  brain]
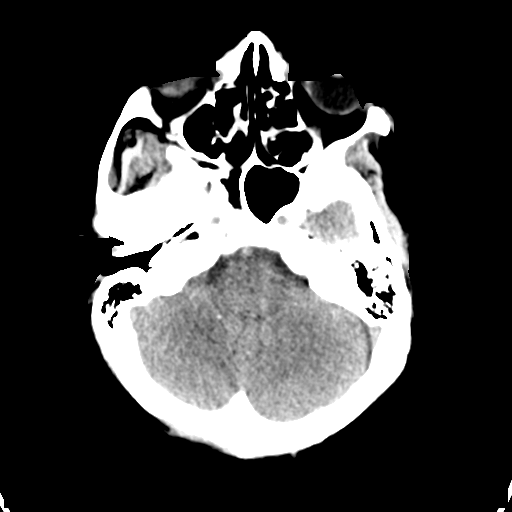
[im 13/35  brain]
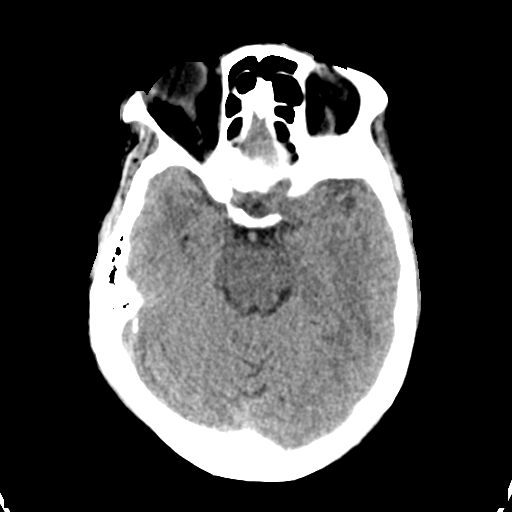
[im 18/35  brain]
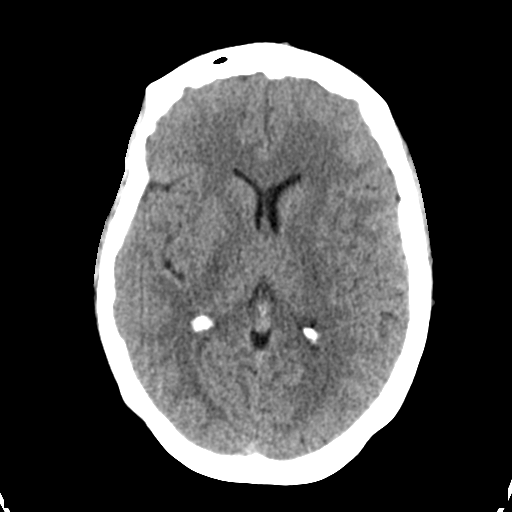
[im 22/35  brain]
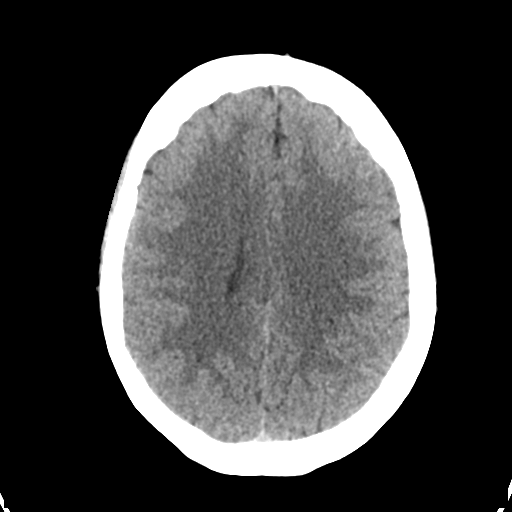
[im 22/35  bone]
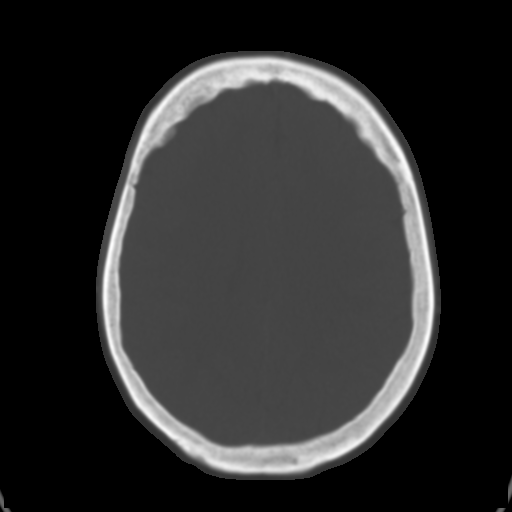
[im 26/35  brain]
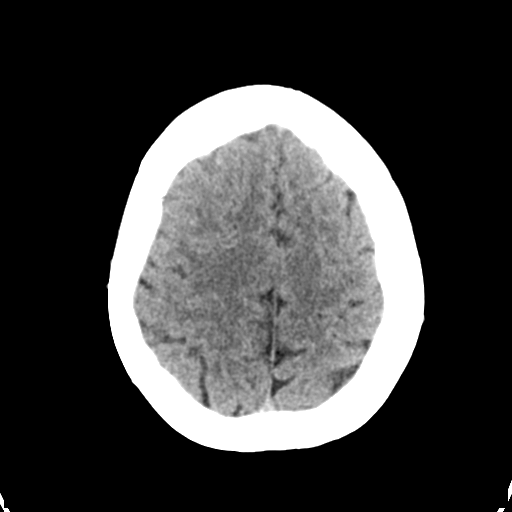
[im 30/35  brain]
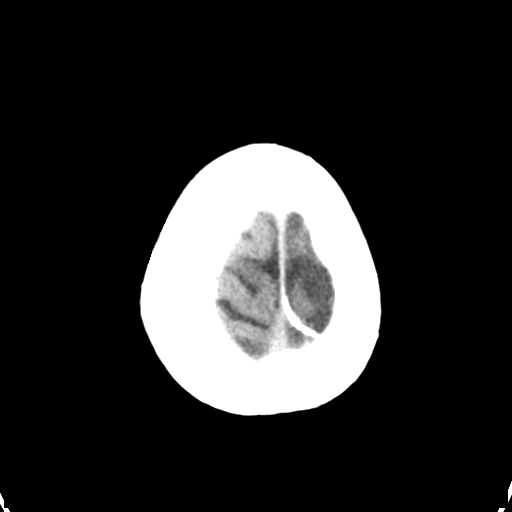

[Series 4: head bone · axial · 0.41mm/px · z∈[+1280,+1340]mm · 4 of 86 slices shown]
[im 9/86  bone]
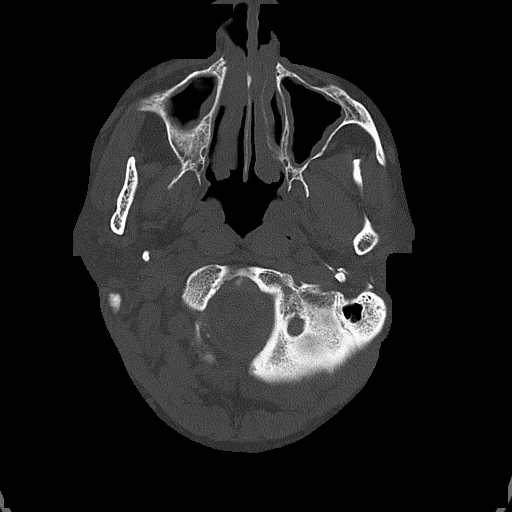
[im 18/86  bone]
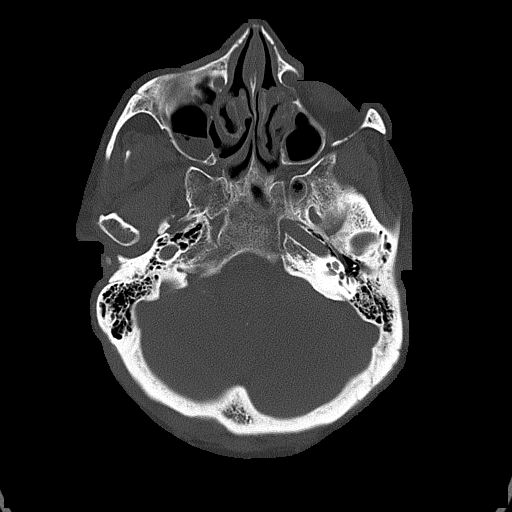
[im 26/86  bone]
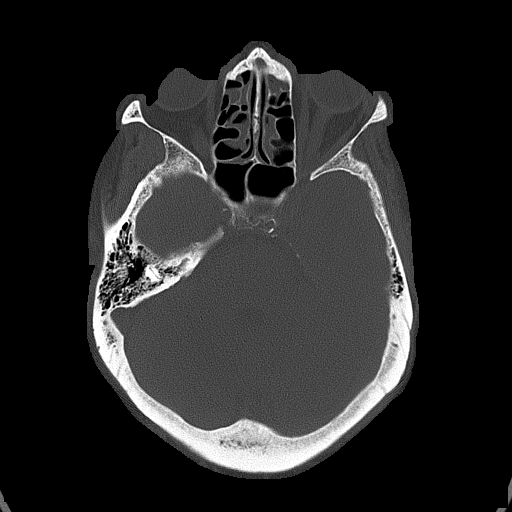
[im 39/86  bone]
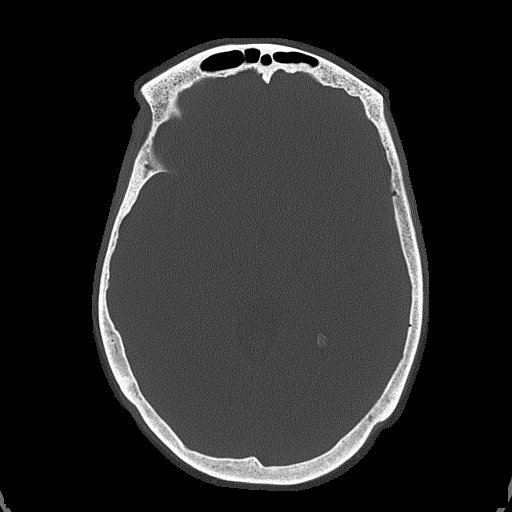

[Series 5: head without cor · coronal · non-contrast · 0.32mm/px · 3 of 67 slices shown]
[im 23/67  brain]
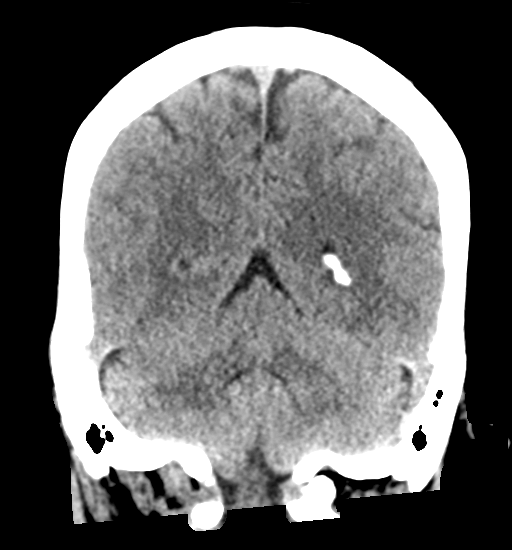
[im 30/67  brain]
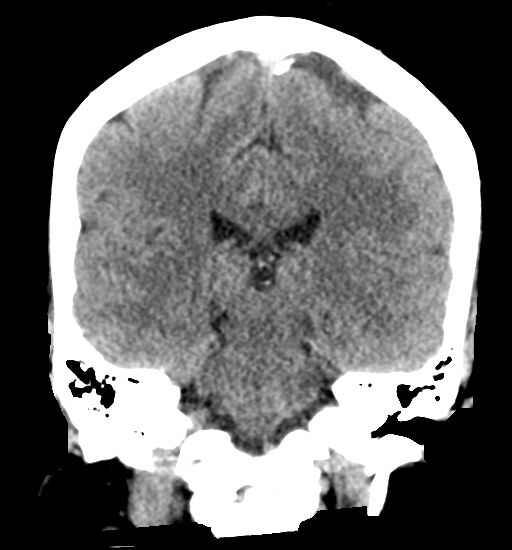
[im 37/67  brain]
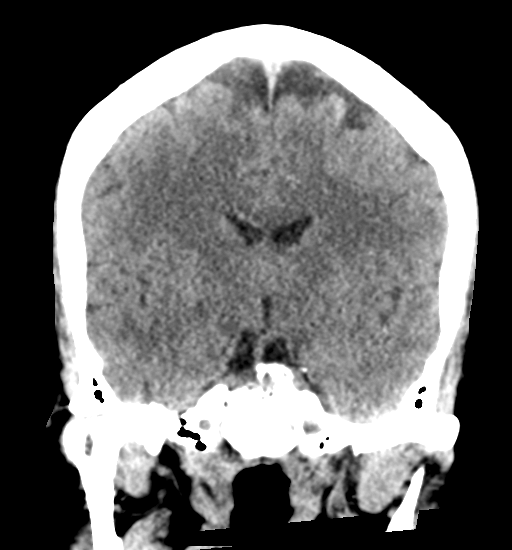

[Series 6: head without sag · sagittal · non-contrast · 0.33mm/px · 3 of 54 slices shown]
[im 18/54  brain]
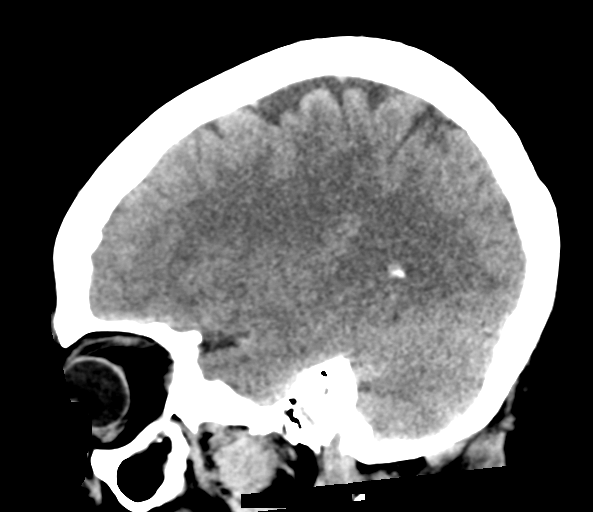
[im 27/54  brain]
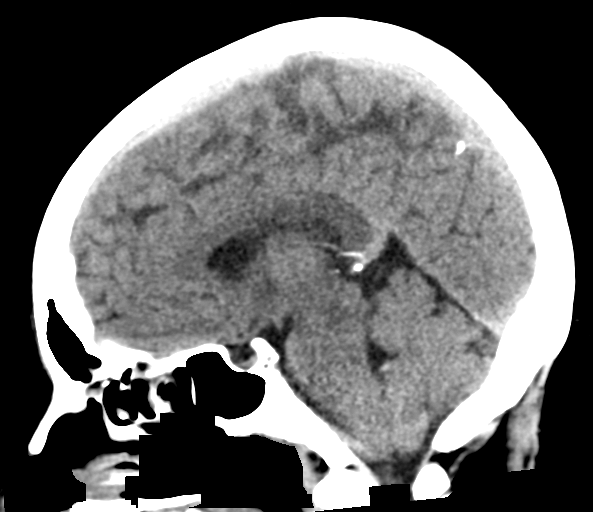
[im 36/54  brain]
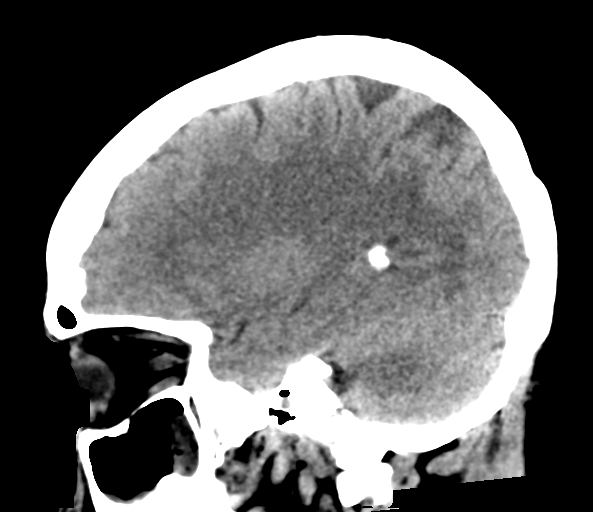

[17 of 47 positions shown; findings below may reference images not displayed]

FINDINGS: Brain: No evidence of acute infarction, hemorrhage, hydrocephalus,
extra-axial collection or mass lesion/mass effect.

Vascular: No hyperdense vessel or unexpected calcification.

Skull: Normal. Negative for fracture or focal lesion.

Sinuses/Orbits: The orbits are normal symmetric. Paranasal sinuses
are well developed with opacification over the frontal ethmoidal
recesses, ethmoidal cells and maxillary sinuses with small air-fluid
level over the right maxillary sinus. Mastoid air cells are clear.

Other: None.
IMPRESSION: No acute intracranial findings.

Inflammatory change of the sinuses with air-fluid level over the
right maxillary sinus as this may be seen with acute sinusitis.

## 2018-07-30 ENCOUNTER — Encounter: Payer: BLUE CROSS/BLUE SHIELD | Admitting: Physician Assistant

## 2018-08-03 ENCOUNTER — Encounter: Payer: Self-pay | Admitting: Physician Assistant

## 2018-11-22 ENCOUNTER — Ambulatory Visit (INDEPENDENT_AMBULATORY_CARE_PROVIDER_SITE_OTHER): Payer: Medicare Other | Admitting: Family Medicine

## 2018-11-22 DIAGNOSIS — Z23 Encounter for immunization: Secondary | ICD-10-CM

## 2018-12-20 ENCOUNTER — Encounter: Payer: Self-pay | Admitting: Family Medicine

## 2019-05-06 ENCOUNTER — Encounter: Payer: Medicare Other | Admitting: Family Medicine

## 2020-02-25 ENCOUNTER — Ambulatory Visit: Payer: BLUE CROSS/BLUE SHIELD

## 2020-03-10 ENCOUNTER — Ambulatory Visit: Payer: BLUE CROSS/BLUE SHIELD

## 2020-04-16 ENCOUNTER — Ambulatory Visit: Payer: Medicare Other | Attending: Internal Medicine

## 2020-04-16 DIAGNOSIS — Z23 Encounter for immunization: Secondary | ICD-10-CM

## 2020-04-16 NOTE — Progress Notes (Signed)
   Covid-19 Vaccination Clinic  Name:  Amanda Kemp    MRN: [id number] DOB: [birth year]  04/16/2020  Amanda Kemp was observed post Covid-19 immunization for 15 minutes without incident. She was provided with Vaccine Information Sheet and instruction to access the V-Safe system.   Amanda Kemp was instructed to call 911 with any severe reactions post vaccine: Marland Kitchen Difficulty breathing  . Swelling of face and throat  . A fast heartbeat  . A bad rash all over body  . Dizziness and weakness   Immunizations Administered    Name Date Dose VIS Date Route   Pfizer COVID-19 Vaccine 04/16/2020 12:16 PM 0.3 mL 02/08/2019 Intramuscular   Manufacturer: [employer], [employer]   Lot: [id number]   NDC: [address]-1

## 2020-05-07 ENCOUNTER — Ambulatory Visit: Payer: Medicare Other | Attending: Internal Medicine

## 2020-05-07 DIAGNOSIS — Z23 Encounter for immunization: Secondary | ICD-10-CM

## 2020-05-07 NOTE — Progress Notes (Signed)
   Covid-19 Vaccination Clinic  Name:  Amanda Kemp    MRN: [id number] DOB: 1951-06-11  05/07/2020  Amanda Kemp was observed post Covid-19 immunization for 15 minutes without incident. Amanda Kemp was provided with Vaccine Information Sheet and instruction to access the V-Safe system.   Amanda Kemp was instructed to call 911 with any severe reactions post vaccine: Marland Kitchen Difficulty breathing  . Swelling of face and throat  . A fast heartbeat  . A bad rash all over body  . Dizziness and weakness   Immunizations Administered    Name Date Dose VIS Date Route   Pfizer COVID-19 Vaccine 05/07/2020 11:56 AM 0.3 mL 02/08/2019 Intramuscular   Manufacturer: [employer], [employer]   Lot: [id number]   NDC: [address]-4

## 2021-11-02 ENCOUNTER — Emergency Department (HOSPITAL_COMMUNITY): Payer: Medicare HMO

## 2021-11-02 ENCOUNTER — Other Ambulatory Visit: Payer: Self-pay

## 2021-11-02 ENCOUNTER — Emergency Department (HOSPITAL_COMMUNITY)
Admission: EM | Admit: 2021-11-02 | Discharge: 2021-11-02 | Disposition: A | Discharge status: Home / Self Care | Payer: Medicare HMO | Attending: Emergency Medicine | Admitting: Emergency Medicine

## 2021-11-02 ENCOUNTER — Encounter (HOSPITAL_COMMUNITY): Payer: Self-pay | Admitting: Emergency Medicine

## 2021-11-02 DIAGNOSIS — M25512 Pain in left shoulder: Secondary | ICD-10-CM | POA: Diagnosis not present

## 2021-11-02 DIAGNOSIS — I10 Essential (primary) hypertension: Secondary | ICD-10-CM | POA: Insufficient documentation

## 2021-11-02 DIAGNOSIS — W109XXA Fall (on) (from) unspecified stairs and steps, initial encounter: Secondary | ICD-10-CM | POA: Diagnosis not present

## 2021-11-02 DIAGNOSIS — M25522 Pain in left elbow: Secondary | ICD-10-CM | POA: Insufficient documentation

## 2021-11-02 DIAGNOSIS — S79922A Unspecified injury of left thigh, initial encounter: Secondary | ICD-10-CM | POA: Diagnosis present

## 2021-11-02 DIAGNOSIS — W19XXXA Unspecified fall, initial encounter: Secondary | ICD-10-CM

## 2021-11-02 DIAGNOSIS — S40022A Contusion of left upper arm, initial encounter: Secondary | ICD-10-CM

## 2021-11-02 DIAGNOSIS — S7012XA Contusion of left thigh, initial encounter: Secondary | ICD-10-CM | POA: Diagnosis not present

## 2021-11-02 MED ORDER — HYDROMORPHONE HCL 1 MG/ML IJ SOLN
1.0000 mg | Freq: Once | INTRAMUSCULAR | Status: AC
  Administered 2021-11-02: 1 mg via INTRAMUSCULAR
  Filled 2021-11-02: qty 1

## 2021-11-02 MED ORDER — ACETAMINOPHEN 325 MG PO TABS: 650.0000 mg | ORAL_TABLET | Freq: Four times a day (QID) | ORAL | Status: DC | PRN

## 2021-11-02 MED ORDER — CYCLOBENZAPRINE HCL 5 MG PO TABS: 5.0000 mg | ORAL_TABLET | Freq: Three times a day (TID) | ORAL | 0 refills | Status: AC | PRN

## 2021-11-02 MED ORDER — HYDROCODONE-ACETAMINOPHEN 5-325 MG PO TABS: 1.0000 | ORAL_TABLET | Freq: Four times a day (QID) | ORAL | 0 refills | Status: AC | PRN

## 2021-11-02 MED ORDER — CYCLOBENZAPRINE HCL 10 MG PO TABS
5.0000 mg | ORAL_TABLET | Freq: Once | ORAL | Status: AC
  Administered 2021-11-02: 5 mg via ORAL
  Filled 2021-11-02: qty 1

## 2021-11-02 NOTE — ED Provider Notes (Signed)
Emergency Medicine Provider Triage Evaluation Note  Amanda Kemp , a 70 y.o. female  was evaluated in triage.  Patient states that today she tripped and fell down steps about 1 hour ago.  Says about 6 step lengths.  Denies hitting head.  Denies loss of consciousness.  Denies neck or back pain.  She reports left shoulder pain, left elbow pain, left hip and left upper thigh pain.  She was able to ambulate following this event.  She denies any numbness or tingling in her upper or lower extremities.  Denies blood thinners.  Review of Systems  Positive: Arthralgias, weakness Negative: Numbness, tingling, neck pain, back pain, headaches  Physical Exam  BP (!) 148/76 (BP Location: Right Arm)   Pulse 72   Temp 98 F (36.7 C) (Oral)   Resp 16   SpO2 100%  Gen:   Awake, appears in pain Resp:  Normal effort  MSK:   Difficulty moving left shoulder and left elbow.  Difficulty moving left leg.  Neurovascularly intact.  Radial and pedal pulses 2+ bilaterally.  Normal sensation. Other:    Medical Decision Making  Medically screening exam initiated at 1:53 PM.  Appropriate orders placed.  Amanda Kemp was informed that the remainder of the evaluation will be completed by another provider, this initial triage assessment does not replace that evaluation, and the importance of remaining in the ED until their evaluation is complete.     Claudie Leach, PA-C 11/02/21 1355    Margarita Grizzle, MD 11/03/21 [phone]

## 2021-11-02 NOTE — ED Provider Notes (Signed)
[facility] EMERGENCY DEPARTMENT Provider Note   CSN: [id number] Arrival date & time: 11/02/21  1306     History Chief Complaint  Patient presents with   [name]    Amanda Kemp is a 70 y.o. female hx of HTN, here presenting with fall.  Patient states that she tripped and fell down about 6 steps patient hit the left shoulder and left elbow and left hip.  Patient states that it was sore and tender afterwards.  Denies any head injury.  Denies any loss of consciousness.   The history is provided by the patient.      Past Medical History:  Diagnosis Date   Allergy    Hypertension    Thyroid disease     Patient Active Problem List   Diagnosis Date Noted   Environmental and seasonal allergies 05/08/2015    Past Surgical History:  Procedure Laterality Date   ABDOMINAL HYSTERECTOMY       OB History   No obstetric history on file.     Family History  Problem Relation Age of Onset   Hyperlipidemia Mother    Hypertension Mother    Cancer Mother    Cancer Brother        prostate cancer   Cancer Sister        kidney cancer   Cancer Sister        lung cancer    Social History   Tobacco Use   Smoking status: Never   Smokeless tobacco: Never  Substance Use Topics   Alcohol use: No    Alcohol/week: 0.0 standard drinks   Drug use: No    Home Medications Prior to Admission medications   Medication Sig Start Date End Date Taking? Authorizing Provider  famotidine (PEPCID) 20 MG tablet Take 1 tablet (20 mg total) by mouth at bedtime as needed for heartburn or indigestion. 07/29/17   Ofilia Neas, PA-C  fluticasone (FLONASE SENSIMIST) 27.5 MCG/SPRAY nasal spray Place 2 sprays into the nose daily. 04/21/18   Ofilia Neas, PA-C  fluticasone (FLONASE) 50 MCG/ACT nasal spray Place 1 spray daily into both nostrils. 10/28/17   Petrucelli, Samantha R, PA-C  levothyroxine (SYNTHROID, LEVOTHROID) 100 MCG tablet Take 1 tablet (100 mcg total) by mouth  daily. Please stop taking 88 mcg daily and take this instead. 04/21/18   Ofilia Neas, PA-C  predniSONE (DELTASONE) 50 MG tablet Take one tab daily for three days. 04/21/18   Ofilia Neas, PA-C  rosuvastatin (CRESTOR) 10 MG tablet Take 1 tablet (10 mg total) by mouth daily. 04/21/18   Ofilia Neas, PA-C    Allergies    Patient has no known allergies.  Review of Systems   Review of Systems  Musculoskeletal:        L arm and leg pain   All other systems reviewed and are negative.  Physical Exam Updated Vital Signs BP (!) 148/76 (BP Location: Right Arm)   Pulse 72   Temp 98 F (36.7 C) (Oral)   Resp 16   SpO2 100%   Physical Exam Vitals and nursing note reviewed.  Constitutional:      Comments: Uncomfortable   HENT:     Head: Normocephalic and atraumatic.     Nose: Nose normal.     Mouth/Throat:     Mouth: Mucous membranes are moist.  Eyes:     Extraocular Movements: Extraocular movements intact.     Pupils: Pupils are equal, round, and reactive to  light.  Cardiovascular:     Rate and Rhythm: Normal rate and regular rhythm.     Pulses: Normal pulses.     Heart sounds: Normal heart sounds.  Pulmonary:     Effort: Pulmonary effort is normal.     Breath sounds: Normal breath sounds.  Abdominal:     General: Abdomen is flat.     Palpations: Abdomen is soft.  Musculoskeletal:     Cervical back: Normal range of motion and neck supple.     Comments: Tenderness in the left shoulder and diffusely on the left humerus and forearm.  No obvious deformity.  Patient has bruising in the left posterior thigh area.  Patient has normal range of motion of the left hip.  No midline spinal tenderness  Skin:    General: Skin is warm.     Capillary Refill: Capillary refill takes less than 2 seconds.  Neurological:     General: No focal deficit present.     Mental Status: She is alert and oriented to person, place, and time.     Cranial Nerves: No cranial nerve deficit.     Sensory:  No sensory deficit.     Motor: No weakness.     Coordination: Coordination normal.  Psychiatric:        Mood and Affect: Mood normal.        Behavior: Behavior normal.    ED Results / Procedures / Treatments   Labs (all labs ordered are listed, but only abnormal results are displayed) Labs Reviewed - No data to display  EKG None  Radiology DG Elbow Complete Left  Result Date: 11/02/2021 CLINICAL DATA:  Pt slipped and fell on steps 1 hour ago. Denies LOC. Denies neck and back pain. Reports L shoulder, L upper arm, and L upper leg pain. (Per er triage notes) EXAM: LEFT ELBOW - COMPLETE 3+ VIEW COMPARISON:  None. FINDINGS: There is no evidence of fracture, dislocation, or joint effusion. There is no evidence of arthropathy or other focal bone abnormality. Soft tissues are unremarkable. IMPRESSION: Negative. Electronically Signed   By: Emmaline Kluver M.D.   On: 11/02/2021 15:45   DG Shoulder Left  Result Date: 11/02/2021 CLINICAL DATA:  Pt slipped and fell on steps 1 hour ago. Denies LOC. Denies neck and back pain. Reports L shoulder, L upper arm, and L upper leg pain. (Per er triage notes) EXAM: LEFT SHOULDER - 2+ VIEW COMPARISON:  None. FINDINGS: No definite acute fracture. Mild bony irregularity along the acromion favored to be degenerative. No evidence of dislocation. Regional soft tissues are unremarkable. IMPRESSION: No definite acute osseous abnormality. Bony irregularity along the acromion favored to represent degenerative changes. Electronically Signed   By: Emmaline Kluver M.D.   On: 11/02/2021 15:45   DG Hip Unilat With Pelvis 2-3 Views Left  Result Date: 11/02/2021 CLINICAL DATA:  Fall.  Patient reports left upper leg pain. EXAM: DG HIP (WITH OR WITHOUT PELVIS) 2-3V LEFT COMPARISON:  None. FINDINGS: There is no evidence of hip fracture or dislocation. There is no evidence of arthropathy or other focal bone abnormality. IMPRESSION: Negative. Electronically Signed   By: Emmaline Kluver M.D.   On: 11/02/2021 15:46   DG FEMUR MIN 2 VIEWS LEFT  Result Date: 11/02/2021 CLINICAL DATA:  Fall.  Left upper leg pain. EXAM: LEFT FEMUR 2 VIEWS COMPARISON:  None. FINDINGS: There is no evidence of fracture or other focal bone lesions. Soft tissues are unremarkable. IMPRESSION: Negative. Electronically Signed   By:  Emmaline Kluver M.D.   On: 11/02/2021 15:42    Procedures Procedures   Medications Ordered in ED Medications  acetaminophen (TYLENOL) tablet 650 mg (has no administration in time range)  HYDROmorphone (DILAUDID) injection 1 mg (has no administration in time range)  cyclobenzaprine (FLEXERIL) tablet 5 mg (has no administration in time range)    ED Course  I have reviewed the triage vital signs and the nursing notes.  Pertinent labs & imaging results that were available during my care of the patient were reviewed by me and considered in my medical decision making (see chart for details).    MDM Rules/Calculators/A&P                           NAKETA DADDARIO is a 70 y.o. female here presenting with fall.  Patient had a mechanical fall onto the left side.  Appears to have some extremity bruising with no obvious deformity.  X-rays unremarkable.  I think likely muscle contusion.  Plan to discharge home with a sling for comfort, pain medicine and muscle relaxants.   Final Clinical Impression(s) / ED Diagnoses Final diagnoses:  Fall    Rx / DC Orders ED Discharge Orders     None        Charlynne Pander, MD 11/02/21 Rickey Primus

## 2021-11-02 NOTE — Discharge Instructions (Addendum)
You likely have a contusion of your arm and your thigh.  Take Motrin for pain.  Take Flexeril for muscle spasm  Take Percocet for severe pain  Follow-up with orthopedic doctor if you have persistent pain  Return to ER if you have worse arm pain, leg pain, another fall

## 2021-11-02 NOTE — ED Triage Notes (Signed)
Pt slipped and fell on steps 1 hour ago.  Denies LOC.  Denies neck and back pain.  Reports L shoulder, L upper arm, and L upper leg pain.

## 2021-11-02 NOTE — Progress Notes (Signed)
Orthopedic Tech Progress Note Patient Details:  Amanda Kemp January 08, 1951 [id number]  Ortho Devices Type of Ortho Device: Sling immobilizer Ortho Device/Splint Interventions: Application   Post Interventions Patient Tolerated: Well Instructions Provided: Care of device  Saul Fordyce 11/02/2021, 6:14 PM

## 2021-11-15 NOTE — Progress Notes (Signed)
Office Visit Note  Patient: Amanda Kemp             Date of Birth: 10/26/1951           MRN: [id number]             PCP: Berkley Harvey, NP Referring: Berkley Harvey, NP Visit Date: 11/28/2021 Occupation: _0 @  Subjective:  Pain and swelling in both knees   History of Present Illness: Amanda Kemp is a 70 y.o. female seen in consultation per request of her PCP.  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  She states she was a standing during the her new assignment.  She started experiencing pain and swelling in her bilateral knee joints.  She states she started going to her PCP who gave her cortisone injections to her knee joints which helped her temporarily.  She has had 3 cortisone injections to her left knee joint.  The last injection was about  3 months ago.  She continues to have pain and swelling in her bilateral knee joints.  She states she quit working in October 2022.  Patient states that on November 02, 2021 she fell down the stairs and hurt her left shoulder.  She was seen at the emergency room and her x-rays were normal.  She continues to have pain and discomfort in her left shoulder joint and has difficulty lifting her left arm.  She also has discomfort in her right shoulder.  She has difficulty sleeping on her side due to shoulder joint pain.  She denies any discomfort in her elbows, wrist, hands.  There is no discomfort in her hips ankles or feet.  She denies any discomfort in her entire spine.  There is no family history of autoimmune disease.  She is gravida 3, para 3, miscarriages 0.  There is no history of DVTs.  Activities of Daily Living:  Patient reports morning stiffness for all day. Patient Reports nocturnal pain.  Difficulty dressing/grooming: Reports Difficulty climbing stairs: Denies Difficulty getting out of chair:  Denies Difficulty using hands for taps, buttons, cutlery, and/or writing: Reports  Review of Systems  Constitutional:  Positive for fatigue. Negative for night sweats, weight gain and weight loss.  HENT:  Negative for mouth sores, trouble swallowing, trouble swallowing, mouth dryness and nose dryness.   Eyes:  Negative for pain, redness, itching, visual disturbance and dryness.  Respiratory:  Negative for cough, shortness of breath and difficulty breathing.   Cardiovascular:  Negative for chest pain, palpitations, hypertension, irregular heartbeat and swelling in legs/feet.  Gastrointestinal:  Negative for blood in stool, constipation and diarrhea.  Endocrine: Negative for increased urination.  Genitourinary:  Negative for difficulty urinating and vaginal dryness.  Musculoskeletal:  Positive for joint pain, joint pain, joint swelling, myalgias, morning stiffness, muscle tenderness and myalgias. Negative for muscle weakness.  Skin:  Negative for color change, rash, hair loss, redness, skin tightness, ulcers and sensitivity to sunlight.  Allergic/Immunologic: Negative for susceptible to infections.  Neurological:  Negative for dizziness, numbness, headaches, memory loss, night sweats and weakness.  Hematological:  Negative for bruising/bleeding tendency and swollen glands.  Psychiatric/Behavioral:  Negative for depressed mood, confusion and sleep disturbance. The patient is not nervous/anxious.    PMFS History:  Patient Active Problem List   Diagnosis Date Noted   Elevated LDL cholesterol level 11/28/2021   Prediabetes 11/28/2021  History of thyroid disease 11/28/2021   Essential hypertension 11/28/2021   Environmental and seasonal allergies 05/08/2015    Past Medical History:  Diagnosis Date   Allergy    Hypertension    Thyroid disease     Family History  Problem Relation Age of Onset   Hyperlipidemia Mother    Hypertension Mother    Cancer Mother    Cancer Sister         kidney cancer   Cancer Sister        lung cancer   Cancer Brother        prostate cancer   Healthy Son    Healthy Daughter    Healthy Daughter    Past Surgical History:  Procedure Laterality Date   ABDOMINAL HYSTERECTOMY     Social History   Social History Narrative   Divorced. Education: [employer]. Exercise: Some.   Immunization History  Administered Date(s) Administered   Influenza, High Dose Seasonal PF 11/22/2018   Influenza,inj,Quad PF,6+ Mos 08/15/2017   Influenza-Unspecified 09/15/2016   PFIZER Comirnaty(Gray Top)Covid-19 Tri-Sucrose Vaccine 04/16/2020, 05/07/2020   PFIZER(Purple Top)SARS-COV-2 Vaccination 04/16/2020, 05/07/2020   Pneumococcal Conjugate-13 11/16/2017   Tdap 05/21/2015     Objective: Vital Signs: BP (!) 149/84 (BP Location: Right Arm, Patient Position: Sitting, Cuff Size: Normal)   Pulse 81   Ht 5' 4" (1.626 m)   Wt 188 lb (85.3 kg)   BMI 32.27 kg/m    Physical Exam Vitals and nursing note reviewed.  Constitutional:      Appearance: She is well-developed.  HENT:     Head: Normocephalic and atraumatic.  Eyes:     Conjunctiva/sclera: Conjunctivae normal.  Cardiovascular:     Rate and Rhythm: Normal rate and regular rhythm.     Heart sounds: Normal heart sounds.  Pulmonary:     Effort: Pulmonary effort is normal.     Breath sounds: Normal breath sounds.  Abdominal:     General: Bowel sounds are normal.     Palpations: Abdomen is soft.  Musculoskeletal:     Cervical back: Normal range of motion.  Lymphadenopathy:     Cervical: No cervical adenopathy.  Skin:    General: Skin is warm and dry.     Capillary Refill: Capillary refill takes less than 2 seconds.  Neurological:     Mental Status: She is alert and oriented to person, place, and time.  Psychiatric:        Behavior: Behavior normal.     Musculoskeletal Exam: C-spine was in good range of motion.  Her right shoulder joint abduction was limited to 100 degrees and painful.  She  had limited internal rotation.  Left shoulder joint abduction was limited to about 80 degrees and painful.  She had limited internal rotation.  Elbow joints and wrist joints with good range of motion.  No MCP PIP or DIP thickening or swelling was noted.  Hip joints in good range of motion.  She had warmth on palpation of bilateral knee joints.  Left knee joint was swollen with minimal effusion.  There was no tenderness over ankles or MTPs.  There was no evidence of Achilles tendinitis or plantar fasciitis.  Lab  CDAI Exam: CDAI Score: -- Patient Global: --; Provider Global: -- Swollen: --; Tender: -- Joint Exam 11/28/2021   No joint exam has been documented for this visit   There is currently no information documented on the homunculus. Go to the Rheumatology activity and complete the homunculus joint exam.  Investigation: No additional findings.  Imaging: DG Elbow Complete Left  Result Date: 11/02/2021 CLINICAL DATA:  Pt slipped and fell on steps 1 hour ago. Denies LOC. Denies neck and back pain. Reports L shoulder, L upper arm, and L upper leg pain. (Per er triage notes) EXAM: LEFT ELBOW - COMPLETE 3+ VIEW COMPARISON:  None. FINDINGS: There is no evidence of fracture, dislocation, or joint effusion. There is no evidence of arthropathy or other focal bone abnormality. Soft tissues are unremarkable. IMPRESSION: Negative. Electronically Signed   By: Audie Pinto M.D.   On: 11/02/2021 15:45   DG Shoulder Left  Result Date: 11/02/2021 CLINICAL DATA:  Pt slipped and fell on steps 1 hour ago. Denies LOC. Denies neck and back pain. Reports L shoulder, L upper arm, and L upper leg pain. (Per er triage notes) EXAM: LEFT SHOULDER - 2+ VIEW COMPARISON:  None. FINDINGS: No definite acute fracture. Mild bony irregularity along the acromion favored to be degenerative. No evidence of dislocation. Regional soft tissues are unremarkable. IMPRESSION: No definite acute osseous abnormality. Bony  irregularity along the acromion favored to represent degenerative changes. Electronically Signed   By: Audie Pinto M.D.   On: 11/02/2021 15:45   DG Hip Unilat With Pelvis 2-3 Views Left  Result Date: 11/02/2021 CLINICAL DATA:  Fall.  Patient reports left upper leg pain. EXAM: DG HIP (WITH OR WITHOUT PELVIS) 2-3V LEFT COMPARISON:  None. FINDINGS: There is no evidence of hip fracture or dislocation. There is no evidence of arthropathy or other focal bone abnormality. IMPRESSION: Negative. Electronically Signed   By: Audie Pinto M.D.   On: 11/02/2021 15:46   DG FEMUR MIN 2 VIEWS LEFT  Result Date: 11/02/2021 CLINICAL DATA:  Fall.  Left upper leg pain. EXAM: LEFT FEMUR 2 VIEWS COMPARISON:  None. FINDINGS: There is no evidence of fracture or other focal bone lesions. Soft tissues are unremarkable. IMPRESSION: Negative. Electronically Signed   By: Audie Pinto M.D.   On: 11/02/2021 15:42    Recent Labs: Lab Results  Component Value Date   WBC 5.9 02/03/2018   HGB 12.7 02/03/2018   PLT 287 10/28/2017   NA 143 02/03/2018   K 4.6 02/03/2018   CL 107 (H) 02/03/2018   CO2 22 02/03/2018   GLUCOSE 103 (H) 02/03/2018   BUN 15 02/03/2018   CREATININE 0.80 02/03/2018   BILITOT 0.2 02/03/2018   ALKPHOS 71 02/03/2018   AST 19 02/03/2018   ALT 14 02/03/2018   PROT 7.4 02/03/2018   ALBUMIN 4.2 02/03/2018   CALCIUM 9.4 02/03/2018   GFRAA 89 02/03/2018    August 2022 CBC WBC 9.6, hemoglobin 13.5, MCV 79 low, platelets 363, CMP normal,Hemoglobin A1c 6.1, hep C negative, TSH normal  Speciality Comments: No specialty comments available.  Procedures:  Large Joint Inj: L glenohumeral on 11/28/2021 10:21 AM Indications: pain Details: 27 G 1.5 in needle, posterior approach  Arthrogram: No  Medications: 1.5 mL lidocaine 1 %; 40 mg triamcinolone acetonide 40 MG/ML Aspirate: 0 mL Outcome: tolerated well, no immediate complications Procedure, treatment alternatives, risks and  benefits explained, specific risks discussed. Consent was given by the patient. Immediately prior to procedure a time out was called to verify the correct patient, procedure, equipment, support staff and site/side marked as required. Patient was prepped and draped in the usual sterile fashion.    Allergies: Patient has no known allergies.   Assessment / Plan:     Visit Diagnoses: Chronic pain of both knees -she has  been having pain  in her bilateral knee joints since 2021.  She relates it to her job.  She had to stand for long hours.  She retired in October 2022.  She has been having pain and swelling in her left knee joint.  She states she has had 3 cortisone injections by her PCP over the last 1 year.  The last cortisone injection was about 3 months ago.  She had pain warmth and swelling in her left knee joint with minimal effusion.  Right knee joint was also warm to touch.  I will obtain x-rays and labs today.  She has pain and swelling in multiple joints I will start her on a low-dose prednisone taper until the follow-up visit.  She will be starting on prednisone 10 mg p.o. daily for 2 weeks and taper by 2.5 mg every 2 weeks.  Plan: XR KNEE 3 VIEW RIGHT, XR KNEE 3 VIEW LEFT, x-ray showed moderate to severe osteoarthritis and moderate chondromalacia patella.  Sedimentation rate, Uric acid, Rheumatoid factor, Cyclic citrul peptide antibody, IgG, ANA, Angiotensin converting enzyme  Chronic pain of both shoulders -she has bilateral frozen shoulder more prominent on the left side.  She states the symptoms got worse after her fall in November.  She had x-rays of her left shoulder joint in the emergency room which were within normal limits.  She has nocturnal pain, difficulty lifting her arms and also sleeping on her side.  Plan: XR Shoulder Right.  X-rays are consistent with acromioclavicular arthritis.  Adhesive capsulitis of left shoulder-she is unable to lift her left arm without discomfort.  History of  recent fall-on November 02, 2021 she fell down the stairs.  She was evaluated in the emergency room.  No fractures were noted on the x-rays as described above.  DDD (degenerative disc disease), lumbar-I reviewed her previous x-rays from January 26, 2017 which were consistent with degenerative disc disease.  She denies any lower back pain.  She had no SI joint tenderness.  Myalgia -she has generalized myalgias.  No muscular weakness was noted.  Plan: CK  High risk medication use -in anticipation to start her on immunosuppressive agents I will obtain following labs today.  Plan: Hepatitis B core antibody, IgM, Hepatitis B surface antigen, QuantiFERON-TB Gold Plus, Serum protein electrophoresis with reflex, IgG, IgA, IgM, Glucose 6 phosphate dehydrogenase  Essential hypertension-blood pressure is still elevated.  She is on amlodipine.  History of thyroid disease  Prediabetes  Elevated LDL cholesterol level  Orders: Orders Placed This Encounter  Procedures   XR Shoulder Right   XR KNEE 3 VIEW RIGHT   XR KNEE 3 VIEW LEFT   Sedimentation rate   CK   Uric acid   Rheumatoid factor   Cyclic citrul peptide antibody, IgG   ANA   Angiotensin converting enzyme   Hepatitis B core antibody, IgM   Hepatitis B surface antigen   QuantiFERON-TB Gold Plus   Serum protein electrophoresis with reflex   IgG, IgA, IgM   Glucose 6 phosphate dehydrogenase   HLA-B27 antigen    Meds ordered this encounter  Medications   predniSONE (DELTASONE) 5 MG tablet    Sig: Take 81m po qd for 2 weeks, then take 7.579mpo qd for 2 weeks, then take 46m54mo qd for 2 weeks.    Dispense:  63 tablet    Refill:  0      Follow-Up Instructions: Return for Pain in multiple joints and joint swelling.   ShaBo Merino  MD  Note - This record has been created using Editor, commissioning.  Chart creation errors have been sought, but may not always  have been located. Such creation errors do not reflect on  the  standard of medical care.

## 2021-11-28 ENCOUNTER — Ambulatory Visit: Payer: Self-pay

## 2021-11-28 ENCOUNTER — Ambulatory Visit (INDEPENDENT_AMBULATORY_CARE_PROVIDER_SITE_OTHER): Payer: Medicare HMO | Admitting: Rheumatology

## 2021-11-28 ENCOUNTER — Other Ambulatory Visit: Payer: Self-pay

## 2021-11-28 ENCOUNTER — Encounter: Payer: Self-pay | Admitting: Rheumatology

## 2021-11-28 VITALS — BP 149/84 | HR 81 | Ht 64.0 in | Wt 188.0 lb

## 2021-11-28 DIAGNOSIS — M1712 Unilateral primary osteoarthritis, left knee: Secondary | ICD-10-CM | POA: Diagnosis not present

## 2021-11-28 DIAGNOSIS — I10 Essential (primary) hypertension: Secondary | ICD-10-CM

## 2021-11-28 DIAGNOSIS — M2242 Chondromalacia patellae, left knee: Secondary | ICD-10-CM

## 2021-11-28 DIAGNOSIS — M25511 Pain in right shoulder: Secondary | ICD-10-CM | POA: Diagnosis not present

## 2021-11-28 DIAGNOSIS — M25561 Pain in right knee: Secondary | ICD-10-CM | POA: Diagnosis not present

## 2021-11-28 DIAGNOSIS — G8929 Other chronic pain: Secondary | ICD-10-CM

## 2021-11-28 DIAGNOSIS — R7303 Prediabetes: Secondary | ICD-10-CM

## 2021-11-28 DIAGNOSIS — E7800 Pure hypercholesterolemia, unspecified: Secondary | ICD-10-CM

## 2021-11-28 DIAGNOSIS — Z9181 History of falling: Secondary | ICD-10-CM

## 2021-11-28 DIAGNOSIS — M25512 Pain in left shoulder: Secondary | ICD-10-CM | POA: Diagnosis not present

## 2021-11-28 DIAGNOSIS — M7502 Adhesive capsulitis of left shoulder: Secondary | ICD-10-CM

## 2021-11-28 DIAGNOSIS — Z79899 Other long term (current) drug therapy: Secondary | ICD-10-CM

## 2021-11-28 DIAGNOSIS — M51369 Other intervertebral disc degeneration, lumbar region without mention of lumbar back pain or lower extremity pain: Secondary | ICD-10-CM

## 2021-11-28 DIAGNOSIS — M791 Myalgia, unspecified site: Secondary | ICD-10-CM

## 2021-11-28 DIAGNOSIS — M5136 Other intervertebral disc degeneration, lumbar region: Secondary | ICD-10-CM

## 2021-11-28 DIAGNOSIS — M25562 Pain in left knee: Secondary | ICD-10-CM

## 2021-11-28 DIAGNOSIS — Z8639 Personal history of other endocrine, nutritional and metabolic disease: Secondary | ICD-10-CM

## 2021-11-28 MED ORDER — TRIAMCINOLONE ACETONIDE 40 MG/ML IJ SUSP
40.0000 mg | INTRAMUSCULAR | Status: AC | PRN
  Administered 2021-11-28: 40 mg via INTRA_ARTICULAR

## 2021-11-28 MED ORDER — PREDNISONE 5 MG PO TABS: ORAL_TABLET | ORAL | 0 refills | Status: AC

## 2021-11-28 MED ORDER — LIDOCAINE HCL 1 % IJ SOLN
1.5000 mL | INTRAMUSCULAR | Status: AC | PRN
  Administered 2021-11-28: 1.5 mL

## 2021-12-02 NOTE — Progress Notes (Signed)
Please add ENA if possible

## 2021-12-03 ENCOUNTER — Telehealth: Payer: Self-pay | Admitting: *Deleted

## 2021-12-03 DIAGNOSIS — G8929 Other chronic pain: Secondary | ICD-10-CM

## 2021-12-03 DIAGNOSIS — M25511 Pain in right shoulder: Secondary | ICD-10-CM

## 2021-12-03 DIAGNOSIS — M791 Myalgia, unspecified site: Secondary | ICD-10-CM

## 2021-12-03 DIAGNOSIS — Z79899 Other long term (current) drug therapy: Secondary | ICD-10-CM

## 2021-12-03 DIAGNOSIS — M5136 Other intervertebral disc degeneration, lumbar region: Secondary | ICD-10-CM

## 2021-12-03 NOTE — Telephone Encounter (Signed)
-----   Message from Pollyann Savoy, MD sent at 12/03/2021 12:30 PM EST ----- If it is not inconvenient for patient then she may come in for ENA, C3-C4, anticardiolipin, lupus anticoagulant and beta-2 GP 1 otherwise we can obtain labs at the follow-up visit.

## 2021-12-03 NOTE — Progress Notes (Signed)
If it is not inconvenient for patient then she may come in for ENA, C3-C4, anticardiolipin, lupus anticoagulant and beta-2 GP 1 otherwise we can obtain labs at the follow-up visit.

## 2021-12-04 LAB — CK: Total CK: 80 U/L (ref 29–143)

## 2021-12-04 LAB — HEPATITIS B CORE ANTIBODY, IGM: Hep B C IgM: NONREACTIVE

## 2021-12-04 LAB — QUANTIFERON-TB GOLD PLUS
Mitogen-NIL: 0.09 IU/mL
NIL: 0.08 IU/mL
QuantiFERON-TB Gold Plus: POSITIVE — AB
TB1-NIL: 0.1 IU/mL
TB2-NIL: >10 IU/mL

## 2021-12-04 LAB — PROTEIN ELECTROPHORESIS, SERUM, WITH REFLEX
Albumin ELP: 4.2 g/dL (ref 3.8–4.8)
Alpha 1: 0.3 g/dL (ref 0.2–0.3)
Alpha 2: 0.7 g/dL (ref 0.5–0.9)
Beta 2: 0.5 g/dL (ref 0.2–0.5)
Beta Globulin: 0.4 g/dL (ref 0.4–0.6)
Gamma Globulin: 1.1 g/dL (ref 0.8–1.7)
Total Protein: 7.2 g/dL (ref 6.1–8.1)

## 2021-12-04 LAB — ANA: Anti Nuclear Antibody (ANA): POSITIVE — AB

## 2021-12-04 LAB — ANGIOTENSIN CONVERTING ENZYME: Angiotensin-Converting Enzyme: 30 U/L (ref 9–67)

## 2021-12-04 LAB — HEPATITIS B SURFACE ANTIGEN: Hepatitis B Surface Ag: NONREACTIVE

## 2021-12-04 LAB — URIC ACID: Uric Acid, Serum: 7.5 mg/dL — ABNORMAL HIGH (ref 2.5–7.0)

## 2021-12-04 LAB — IGG, IGA, IGM
IgG (Immunoglobin G), Serum: 1399 mg/dL (ref 600–1540)
IgM, Serum: 55 mg/dL (ref 50–300)
Immunoglobulin A: 262 mg/dL (ref 70–320)

## 2021-12-04 LAB — GLUCOSE 6 PHOSPHATE DEHYDROGENASE: G-6PDH: 28 U/g Hgb — ABNORMAL HIGH (ref 7.0–20.5)

## 2021-12-04 LAB — ANTI-NUCLEAR AB-TITER (ANA TITER): ANA Titer 1: 1:80 {titer} — ABNORMAL HIGH

## 2021-12-04 LAB — SEDIMENTATION RATE: Sed Rate: 9 mm/h (ref 0–30)

## 2021-12-04 LAB — CYCLIC CITRUL PEPTIDE ANTIBODY, IGG: Cyclic Citrullin Peptide Ab: <16 UNITS

## 2021-12-04 LAB — RHEUMATOID FACTOR: Rheumatoid fact SerPl-aCnc: <14 IU/mL (ref <14)

## 2021-12-04 NOTE — Progress Notes (Signed)
TB Gold is positive.  Please refer her to infectious disease for the evaluation and treatment.

## 2021-12-05 ENCOUNTER — Other Ambulatory Visit: Payer: Self-pay | Admitting: *Deleted

## 2021-12-05 DIAGNOSIS — R7612 Nonspecific reaction to cell mediated immunity measurement of gamma interferon antigen response without active tuberculosis: Secondary | ICD-10-CM

## 2021-12-12 ENCOUNTER — Other Ambulatory Visit: Payer: Self-pay

## 2021-12-12 ENCOUNTER — Ambulatory Visit (INDEPENDENT_AMBULATORY_CARE_PROVIDER_SITE_OTHER): Payer: Medicare HMO | Admitting: Internal Medicine

## 2021-12-12 ENCOUNTER — Encounter: Payer: Self-pay | Admitting: Internal Medicine

## 2021-12-12 ENCOUNTER — Ambulatory Visit
Admission: RE | Admit: 2021-12-12 | Discharge: 2021-12-12 | Disposition: A | Discharge status: Home / Self Care | Payer: Medicare HMO | Source: Ambulatory Visit | Attending: Internal Medicine | Admitting: Internal Medicine

## 2021-12-12 DIAGNOSIS — Z227 Latent tuberculosis: Secondary | ICD-10-CM

## 2021-12-12 MED ORDER — RIFAMPIN 300 MG PO CAPS: 600.0000 mg | ORAL_CAPSULE | Freq: Every day | ORAL | 3 refills | Status: DC

## 2021-12-12 NOTE — Progress Notes (Signed)
[facility]      Reason for Consult:latent Tb    Referring Physician: Dr. Corliss Skains    Patient ID: Amanda Kemp, female    DOB: 15-Aug-1951, 70 y.o.   MRN: [id number]  HPI:   Here for evaluation of a positive Quantiferon Gold test.   She has had issues with bilateral knee pain with some relief with injections and was referred to rheumatology for evaluation.  She was evaluated for the possibility of immunosuppressive medications including the Quantiferon Gold test and was positive.  She has no history of Tb, never lived in an endemic country and has had no nigh sweats, no weight loss, no sob or fever.  She is currently on prednisone.   Past Medical History:  Diagnosis Date   Allergy    Hypertension    Thyroid disease     Prior to Admission medications   Medication Sig Start Date End Date Taking? Authorizing Provider  amLODipine (NORVASC) 5 MG tablet Take 5 mg by mouth daily. 10/29/21  Yes [provider]  cyclobenzaprine (FLEXERIL) 5 MG tablet Take 1 tablet (5 mg total) by mouth 3 (three) times daily as needed. 11/02/21  Yes Charlynne Pander, MD  famotidine (PEPCID) 20 MG tablet Take 1 tablet (20 mg total) by mouth at bedtime as needed for heartburn or indigestion. 07/29/17  Yes Ofilia Neas, PA-C  levothyroxine (SYNTHROID, LEVOTHROID) 100 MCG tablet Take 1 tablet (100 mcg total) by mouth daily. Please stop taking 88 mcg daily and take this instead. 04/21/18  Yes Ofilia Neas, PA-C  predniSONE (DELTASONE) 5 MG tablet Take 10mg  po qd for 2 weeks, then take 7.5mg  po qd for 2 weeks, then take 5mg  po qd for 2 weeks. 11/28/21  Yes Deveshwar, , MD  rifampin (RIFADIN) 300 MG capsule Take 2 capsules (600 mg total) by mouth daily. 12/12/21  Yes Mikala Podoll, Janalyn Rouse, MD  rosuvastatin (CRESTOR) 20 MG tablet Take 1 tablet by mouth daily. 10/29/21  Yes [provider]  fluticasone (FLONASE SENSIMIST) 27.5 MCG/SPRAY nasal spray Place 2  sprays into the nose daily. Patient not taking: Reported on 11/28/2021 04/21/18   11/30/2021, PA-C  fluticasone [facility]) 50 MCG/ACT nasal spray Place 1 spray daily into both nostrils. Patient not taking: Reported on 12/12/2021 10/28/17   Petrucelli, 12/14/2021, PA-C  HYDROcodone-acetaminophen (NORCO/VICODIN) 5-325 MG tablet Take 1 tablet by mouth every 6 (six) hours as needed. Patient not taking: Reported on 11/28/2021 11/02/21   11/30/2021, MD  rosuvastatin (CRESTOR) 10 MG tablet Take 1 tablet (10 mg total) by mouth daily. Patient not taking: Reported on 11/28/2021 04/21/18   11/30/2021, PA-C    No Known Allergies  Social History   Tobacco Use   Smoking status: Never   Smokeless tobacco: Never  Vaping Use   Vaping Use: Never used  Substance Use Topics   Alcohol use: No    Alcohol/week: 0.0 standard drinks   Drug use: No    Family History  Problem Relation Age of Onset   Hyperlipidemia Mother    Hypertension Mother    Cancer Mother    Cancer Sister        kidney cancer   Cancer Sister        lung cancer   Cancer Brother        prostate cancer   Healthy Son    Healthy Daughter    Healthy Daughter     Review  of Systems  Constitutional: negative for fevers, chills, sweats, fatigue, and malaise Respiratory: negative for cough, sputum, hemoptysis, or pneumonia All other systems reviewed and are negative    Constitutional: in no apparent distress  Vitals:   12/12/21 1009  BP: 136/85  Pulse: 80  Resp: 16  Temp: 98.5 F (36.9 C)  SpO2: 98%   EYES: anicteric ENMT: no thrush Cardiovascular: Cor RRR Respiratory: clear Musculoskeletal: no pedal edema noted Skin: negatives: no rash Neuro: non-focal  Labs: Lab Results  Component Value Date   WBC 5.9 02/03/2018   HGB 12.7 02/03/2018   HCT 39.3 02/03/2018   MCV 79.1 (A) 02/03/2018   PLT 287 10/28/2017    Lab Results  Component Value Date   CREATININE 0.80 02/03/2018   BUN 15 02/03/2018    NA 143 02/03/2018   K 4.6 02/03/2018   CL 107 (H) 02/03/2018   CO2 22 02/03/2018    Lab Results  Component Value Date   ALT 14 02/03/2018   AST 19 02/03/2018   ALKPHOS 71 02/03/2018   BILITOT 0.2 02/03/2018     Assessment: probable latent Tb.  I discussed treatment options and since there is no immediate plan for a DMARD, will start rifampin for the shortest course and least interactions with prednisone, if evaluation is normal.    Plan: 1)  CXR 2) hepatic function panel and HIV testing 3) rifampin 600 mg daily for 4 months if above negative.   Follow up in 4 weeks

## 2021-12-13 ENCOUNTER — Other Ambulatory Visit: Payer: Self-pay | Admitting: Internal Medicine

## 2021-12-13 DIAGNOSIS — M170 Bilateral primary osteoarthritis of knee: Secondary | ICD-10-CM | POA: Insufficient documentation

## 2021-12-13 LAB — HEPATIC FUNCTION PANEL
AG Ratio: 1.5 (calc) (ref 1.0–2.5)
ALT: 21 U/L (ref 6–29)
AST: 21 U/L (ref 10–35)
Albumin: 4.3 g/dL (ref 3.6–5.1)
Alkaline phosphatase (APISO): 58 U/L (ref 37–153)
Bilirubin, Direct: 0.1 mg/dL (ref 0.0–0.2)
Globulin: 2.9 g/dL (calc) (ref 1.9–3.7)
Indirect Bilirubin: 0.2 mg/dL (calc) (ref 0.2–1.2)
Total Bilirubin: 0.3 mg/dL (ref 0.2–1.2)
Total Protein: 7.2 g/dL (ref 6.1–8.1)

## 2021-12-13 LAB — HIV ANTIBODY (ROUTINE TESTING W REFLEX): HIV 1&2 Ab, 4th Generation: NONREACTIVE

## 2021-12-13 MED ORDER — RIFAMPIN 300 MG PO CAPS
600.0000 mg | ORAL_CAPSULE | Freq: Every day | ORAL | 3 refills | Status: AC
Start: 2021-12-13 — End: ?

## 2021-12-13 NOTE — Progress Notes (Signed)
Office Visit Note  Patient: Amanda Kemp             Date of Birth: 11/30/1951           MRN: [id number]             PCP: Berkley Harvey, NP Referring: Berkley Harvey, NP Visit Date: 12/25/2021 Occupation: '@GUAROCC' @  Subjective:  Pain and stiffness in joints.   History of Present Illness: Amanda Kemp is a 70 y.o. female with a history of chronic inflammatory arthritis.  She states that prednisone taper helped her with the knee joint pain and inflammation.  She still gets pain in her bilateral knee joints especially her left knee joint.  She has difficulty climbing stairs.  She  had good response to left shoulder joint cortisone injection.  She is able to raise her arm fully now.  She is still taking prednisone 5 mg p.o. daily.  One of the joints are painful today.  She still have significant morning stiffness .  Activities of Daily Living:  Patient reports morning stiffness for 1 hour.   Patient Reports nocturnal pain.  Difficulty dressing/grooming: Denies Difficulty climbing stairs: Reports Difficulty getting out of chair: Denies Difficulty using hands for taps, buttons, cutlery, and/or writing: Denies  Review of Systems  Constitutional:  Positive for fatigue.  HENT:  Negative for mouth sores, mouth dryness and nose dryness.   Eyes:  Negative for pain, itching and dryness.  Respiratory:  Negative for shortness of breath and difficulty breathing.   Cardiovascular:  Negative for chest pain and palpitations.  Gastrointestinal:  Negative for blood in stool, constipation and diarrhea.  Endocrine: Negative for increased urination.  Genitourinary:  Negative for difficulty urinating.  Musculoskeletal:  Positive for joint pain, joint pain, myalgias, morning stiffness, muscle tenderness and myalgias. Negative for joint swelling.  Skin:  Negative for color change, rash and redness.  Allergic/Immunologic: Negative for susceptible to infections.  Neurological:  Negative for  dizziness, numbness, headaches, memory loss and weakness.  Hematological:  Negative for bruising/bleeding tendency.  Psychiatric/Behavioral:  Negative for confusion.    PMFS History:  Patient Active Problem List   Diagnosis Date Noted   Primary osteoarthritis of both knees 12/13/2021   TB lung, latent 12/12/2021   Elevated LDL cholesterol level 11/28/2021   Prediabetes 11/28/2021   History of thyroid disease 11/28/2021   Essential hypertension 11/28/2021   Environmental and seasonal allergies 05/08/2015    Past Medical History:  Diagnosis Date   Allergy    Hypertension    Thyroid disease     Family History  Problem Relation Age of Onset   Hyperlipidemia Mother    Hypertension Mother    Cancer Mother    Cancer Sister        kidney cancer   Cancer Sister        lung cancer   Cancer Brother        prostate cancer   Healthy Son    Healthy Daughter    Healthy Daughter    Past Surgical History:  Procedure Laterality Date   ABDOMINAL HYSTERECTOMY     Social History   Social History Narrative   Divorced. Education: [employer]. Exercise: Some.   Immunization History  Administered Date(s) Administered   Influenza, High Dose Seasonal PF 11/22/2018   Influenza,inj,Quad PF,6+ Mos 08/15/2017   Influenza-Unspecified 09/15/2016   PFIZER Comirnaty(Gray Top)Covid-19 Tri-Sucrose Vaccine 04/16/2020, 05/07/2020   PFIZER(Purple Top)SARS-COV-2 Vaccination 04/16/2020, 05/07/2020   Pneumococcal Conjugate-13 11/16/2017  Tdap 05/21/2015     Objective: Vital Signs: BP (!) 158/101 (BP Location: Right Arm, Patient Position: Sitting, Cuff Size: Normal)    Pulse 79    Ht '5\' 4"'  (1.626 m)    Wt 189 lb 6.4 oz (85.9 kg)    BMI 32.51 kg/m    Physical Exam Vitals and nursing note reviewed.  Constitutional:      Appearance: She is well-developed.  HENT:     Head: Normocephalic and atraumatic.  Eyes:     Conjunctiva/sclera: Conjunctivae normal.  Cardiovascular:     Rate and Rhythm:  Normal rate and regular rhythm.     Heart sounds: Normal heart sounds.  Pulmonary:     Effort: Pulmonary effort is normal.     Breath sounds: Normal breath sounds.  Abdominal:     General: Bowel sounds are normal.     Palpations: Abdomen is soft.  Musculoskeletal:     Cervical back: Normal range of motion.  Lymphadenopathy:     Cervical: No cervical adenopathy.  Skin:    General: Skin is warm and dry.     Capillary Refill: Capillary refill takes less than 2 seconds.  Neurological:     Mental Status: She is alert and oriented to person, place, and time.  Psychiatric:        Behavior: Behavior normal.     Musculoskeletal Exam: C-spine was moved range of motion.  Shoulder joints, elbow joints, wrist joints, MCPs PIPs and DIPs with good range of motion with no synovitis.  Hip joints are in good range of motion.  She had swelling and warmth in her bilateral knee joints.  There was no tenderness over ankles or MTPs  CDAI Exam: CDAI Score: 5.8  Patient Global: 4 mm; Provider Global: 4 mm Swollen: 2 ; Tender: 3  Joint Exam 12/25/2021      Right  Left  Glenohumeral      Tender  Knee  Swollen Tender  Swollen Tender     Investigation: No additional findings.  Imaging: DG Chest 2 View  Result Date: 12/13/2021 CLINICAL DATA:  Latent tuberculosis. EXAM: CHEST - 2 VIEW COMPARISON:  None. FINDINGS: The heart size and mediastinal contours are within normal limits. Both lungs are clear. The visualized skeletal structures are unremarkable. IMPRESSION: No active cardiopulmonary disease. Electronically Signed   By: Marijo Conception M.D.   On: 12/13/2021 09:22   XR KNEE 3 VIEW LEFT  Result Date: 11/28/2021 Moderate to severe medial compartment narrowing with medial and intercondylar osteophytes was noted.  Moderate patellofemoral narrowing was noted.  No chondrocalcinosis was noted. Impression: These findings are consistent with moderate to severe osteoarthritis and moderate chondromalacia  patella.  XR KNEE 3 VIEW RIGHT  Result Date: 11/28/2021 Moderate medial compartment narrowing with medial osteophytes and intercondylar osteophytes was noted.  Tell patellofemoral joint space narrowing was noted.  No chondrocalcinosis was noted. Impression: These findings are consistent with moderate osteoarthritis and moderate chondromalacia patella.    XR Shoulder Right  Result Date: 11/28/2021 No glenohumeral joint space narrowing was noted.  Spurring of acromion was noted.  Acromioclavicular joint space narrowing was noted.  No chondrocalcinosis was noted. Impression: These findings are consistent with acromioclavicular arthritis.   Recent Labs: Lab Results  Component Value Date   WBC 5.9 02/03/2018   HGB 12.7 02/03/2018   PLT 287 10/28/2017   NA 143 02/03/2018   K 4.6 02/03/2018   CL 107 (H) 02/03/2018   CO2 22 02/03/2018   GLUCOSE 103 (  H) 02/03/2018   BUN 15 02/03/2018   CREATININE 0.80 02/03/2018   BILITOT 0.3 12/12/2021   ALKPHOS 71 02/03/2018   AST 21 12/12/2021   ALT 21 12/12/2021   PROT 7.2 12/12/2021   ALBUMIN 4.2 02/03/2018   CALCIUM 9.4 02/03/2018   GFRAA 89 02/03/2018   QFTBGOLDPLUS POSITIVE (A) 11/28/2021   November 28, 2021 SPEP normal, immunoglobulins negative, TB Gold positive, hepatitis B negative, G6PD high, ESR 9, CK 80, uric acid 7.5, RF negative, anti-CCP negative, ACE 30, ANA 1: 80NS  August 2022 CBC WBC 9.6, hemoglobin 13.5, MCV 79 low, platelets 363, CMP normal,Hemoglobin A1c 6.1, hep C negative, TSH normal  Speciality Comments: No specialty comments available.  Procedures:  No procedures performed Allergies: Patient has no known allergies.   Assessment / Plan:     Visit Diagnoses: Chronic inflammatory arthritis - Recurrent pain and swelling in bilateral knee joints over the last year.  She had left frozen shoulder at the last visit which responded to the cortisone injection.  She was placed on prednisone taper which helped her joints overall.   She still had warmth and inflammation in her bilateral knee joints.  Her serologies negative except for positive ANA which is low titer.  Uric acid was elevated but she did not have typical features of gout.  I detailed discussion with the patient with the possibility of seronegative rheumatoid arthritis.  Different treatment options and their side effects were discussed.  I discussed possible use of hydroxychloroquine.  Indications side effects contraindications were discussed.  Handout was given and consent was taken.  Advised to start on hydroxychloroquine 200 mg p.o. twice Monday to Friday.  She was advised to get baseline eye examination and then yearly eye examination.  She will get labs in the month and then every 3 months to monitor for drug toxicity.  Patient was counseled on the purpose, proper use, and adverse effects of hydroxychloroquine including nausea/diarrhea, skin rash, headaches, and sun sensitivity.  Advised patient to wear sunscreen once starting hydroxychloroquine to reduce risk of rash associated with sun sensitivity.  Discussed importance of annual eye exams while on hydroxychloroquine to monitor to ocular toxicity and discussed importance of frequent laboratory monitoring.  Provided patient with eye exam form for baseline ophthalmologic exam.  Reviewed risk for QTC prolongation when used in combination with other QTc prolonging agents (including but not limited to antiarrhythmics, macrolide antibiotics, flouroquinolones, tricyclic antidepressants, citalopram, specific antipsychotics, ondansetron, migraine triptans, and methadone). Provided patient with educational materials on hydroxychloroquine and answered all questions.  Patient consented to hydroxychloroquine. Will upload consent in the media tab.      High risk medication use-labs in 1 month, 3 months and then every 5 months.  Information about immunization was placed in the AVS.  Primary osteoarthritis of both knees - X-rays  showed bilateral moderate chondromalacia patella.  Right knee moderate osteoarthritis and left knee severe osteoarthritis.  X-ray findings were discussed with the patient.  She has been having recurrent knee joint warmth and swelling requiring cortisone injections.  She will eventually require total knee replacement.  I would like to see response to hydroxychloroquine.  She had good response to prednisone taper.  Arthritis of right acromioclavicular joint - bilateral frozen shoulder.  X-rays obtained in the emergency room were negative except for right acromioclavicular arthritis.  Her shoulder was in good range of motion today with some discomfort.  Adhesive capsulitis of left shoulder-resolved after cortisone injection.  History of recent fall - He fell  down the stairs on November 02, 2021.  Her evaluation in the emergency room was unremarkable.  DDD (degenerative disc disease), lumbar-she denies any discomfort today.  Although she does have off-and-on discomfort.  Myalgia - History of generalized myalgia.  X-rays were unremarkable.  CK was normal.  Essential hypertension-her blood pressure was elevated which could be related to prednisone use.  Have advised her to monitor blood pressure closely and follow-up with her PCP.  Elevated LDL cholesterol level  Prediabetes-I would like to avoid cortisone in the future.  History of thyroid disease  Positive QuantiFERON-TB Gold test - Chest x-ray obtained on December 12, 2021 was unremarkable.  She was started on rifampin by ID.  Orders: No orders of the defined types were placed in this encounter.  Meds ordered this encounter  Medications   hydroxychloroquine (PLAQUENIL) 200 MG tablet    Sig: Take 1 tablet by mouth twice daily, Monday through Friday only.    Dispense:  40 tablet    Refill:  2     Follow-Up Instructions: Return in about 6 weeks (around 02/05/2022) for Chronic inflammatory arthritis.   Bo Merino, MD  Note - This  record has been created using Editor, commissioning.  Chart creation errors have been sought, but may not always  have been located. Such creation errors do not reflect on  the standard of medical care.

## 2021-12-16 ENCOUNTER — Telehealth: Payer: Self-pay

## 2021-12-16 NOTE — Telephone Encounter (Signed)
Left patient a HIPPA compliant voicemail regarding labs and restarting medication.  Call pharmacy and confirmed patient has indeed picked up rifampin rx [facility]

## 2021-12-16 NOTE — Telephone Encounter (Signed)
-----   Message from Robert W Comer, MD sent at 12/13/2021  2:18 PM EST ----- °Please let her know the CXR and labs look fine, ok to start the rifampin (just sent in). thanks ° °

## 2021-12-18 ENCOUNTER — Telehealth: Payer: Self-pay

## 2021-12-18 NOTE — Telephone Encounter (Signed)
-----   Message from Gardiner Barefoot, MD sent at 12/13/2021  2:18 PM EST ----- Please let her know the CXR and labs look fine, ok to start the rifampin (just sent in). thanks

## 2021-12-18 NOTE — Telephone Encounter (Signed)
Patient advised of CXR and lab results. Patient also advised she could start the rifampin which she has picked up from the pharmacy. Torrell Krutz T Pricilla Loveless

## 2021-12-25 ENCOUNTER — Encounter: Payer: Self-pay | Admitting: Rheumatology

## 2021-12-25 ENCOUNTER — Ambulatory Visit (INDEPENDENT_AMBULATORY_CARE_PROVIDER_SITE_OTHER): Payer: Medicare (Managed Care) | Admitting: Rheumatology

## 2021-12-25 ENCOUNTER — Other Ambulatory Visit: Payer: Self-pay

## 2021-12-25 VITALS — BP 158/101 | HR 79 | Ht 64.0 in | Wt 189.4 lb

## 2021-12-25 DIAGNOSIS — M1990 Unspecified osteoarthritis, unspecified site: Secondary | ICD-10-CM

## 2021-12-25 DIAGNOSIS — R7612 Nonspecific reaction to cell mediated immunity measurement of gamma interferon antigen response without active tuberculosis: Secondary | ICD-10-CM

## 2021-12-25 DIAGNOSIS — M170 Bilateral primary osteoarthritis of knee: Secondary | ICD-10-CM | POA: Diagnosis not present

## 2021-12-25 DIAGNOSIS — M5136 Other intervertebral disc degeneration, lumbar region: Secondary | ICD-10-CM

## 2021-12-25 DIAGNOSIS — I10 Essential (primary) hypertension: Secondary | ICD-10-CM

## 2021-12-25 DIAGNOSIS — Z79899 Other long term (current) drug therapy: Secondary | ICD-10-CM

## 2021-12-25 DIAGNOSIS — E7800 Pure hypercholesterolemia, unspecified: Secondary | ICD-10-CM

## 2021-12-25 DIAGNOSIS — M791 Myalgia, unspecified site: Secondary | ICD-10-CM

## 2021-12-25 DIAGNOSIS — Z8639 Personal history of other endocrine, nutritional and metabolic disease: Secondary | ICD-10-CM

## 2021-12-25 DIAGNOSIS — M19011 Primary osteoarthritis, right shoulder: Secondary | ICD-10-CM | POA: Diagnosis not present

## 2021-12-25 DIAGNOSIS — Z9181 History of falling: Secondary | ICD-10-CM

## 2021-12-25 DIAGNOSIS — R7303 Prediabetes: Secondary | ICD-10-CM

## 2021-12-25 DIAGNOSIS — M7502 Adhesive capsulitis of left shoulder: Secondary | ICD-10-CM

## 2021-12-25 MED ORDER — HYDROXYCHLOROQUINE SULFATE 200 MG PO TABS: ORAL_TABLET | ORAL | 2 refills | Status: AC

## 2021-12-25 NOTE — Patient Instructions (Signed)
Hydroxychloroquine Tablets What is this medication? HYDROXYCHLOROQUINE (hye drox ee KLOR oh kwin) treats autoimmune conditions, such as rheumatoid arthritis and lupus. It works by slowing down an overactive immune system. It may also be used to prevent and treat malaria. It works by killing the parasite that causes malaria. It belongs to a group of medications called DMARDs. This medicine may be used for other purposes; ask your health care provider or pharmacist if you have questions. COMMON BRAND NAME(S): Plaquenil, Quineprox What should I tell my care team before I take this medication? They need to know if you have any of these conditions: Diabetes Eye disease, vision problems G6PD deficiency Heart disease History of irregular heartbeat If you often drink alcohol Kidney disease Liver disease Porphyria Psoriasis An unusual or allergic reaction to chloroquine, hydroxychloroquine, other medications, foods, dyes, or preservatives Pregnant or trying to get pregnant Breast-feeding How should I use this medication? Take this medication by mouth with a glass of water. Take it as directed on the prescription label. Do not cut, crush or chew this medication. Swallow the tablets whole. Take it with food. Do not take it more than directed. Take all of this medication unless your care team tells you to stop it early. Keep taking it even if you think you are better. Take products with antacids in them at a different time of day than this medication. Take this medication 4 hours before or 4 hours after antacids. Talk to your care team if you have questions. Talk to your care team about the use of this medication in children. While this medication may be prescribed for selected conditions, precautions do apply. Overdosage: If you think you have taken too much of this medicine contact a poison control center or emergency room at once. NOTE: This medicine is only for you. Do not share this medicine with  others. What if I miss a dose? If you miss a dose, take it as soon as you can. If it is almost time for your next dose, take only that dose. Do not take double or extra doses. What may interact with this medication? Do not take this medication with any of the following: Cisapride Dronedarone Pimozide Thioridazine This medication may also interact with the following: Ampicillin Antacids Cimetidine Cyclosporine Digoxin Kaolin Medications for diabetes, like insulin, glipizide, glyburide Medications for seizures like carbamazepine, phenobarbital, phenytoin Mefloquine Methotrexate Other medications that prolong the QT interval (cause an abnormal heart rhythm) Praziquantel This list may not describe all possible interactions. Give your health care provider a list of all the medicines, herbs, non-prescription drugs, or dietary supplements you use. Also tell them if you smoke, drink alcohol, or use illegal drugs. Some items may interact with your medicine. What should I watch for while using this medication? Visit your care team for regular checks on your progress. Tell your care team if your symptoms do not start to get better or if they get worse. You may need blood work done while you are taking this medication. If you take other medications that can affect heart rhythm, you may need more testing. Talk to your care team if you have questions. Your vision may be tested before and during use of this medication. Tell your care team right away if you have any change in your eyesight. This medication may cause serious skin reactions. They can happen weeks to months after starting the medication. Contact your care team right away if you notice fevers or flu-like symptoms with a rash. The   rash may be red or purple and then turn into blisters or peeling of the skin. Or, you might notice a red rash with swelling of the face, lips or lymph nodes in your neck or under your arms. If you or your family  notice any changes in your behavior, such as new or worsening depression, thoughts of harming yourself, anxiety, or other unusual or disturbing thoughts, or memory loss, call your care team right away. What side effects may I notice from receiving this medication? Side effects that you should report to your care team as soon as possible: Allergic reactions--skin rash, itching, hives, swelling of the face, lips, tongue, or throat Aplastic anemia--unusual weakness or fatigue, dizziness, headache, trouble breathing, increased bleeding or bruising Change in vision Heart rhythm changes--fast or irregular heartbeat, dizziness, feeling faint or lightheaded, chest pain, trouble breathing Infection--fever, chills, cough, or sore throat Low blood sugar (hypoglycemia)--tremors or shaking, anxiety, sweating, cold or clammy skin, confusion, dizziness, rapid heartbeat Muscle injury--unusual weakness or fatigue, muscle pain, dark yellow or brown urine, decrease in amount of urine Pain, tingling, or numbness in the hands or feet Rash, fever, and swollen lymph nodes Redness, blistering, peeling, or loosening of the skin, including inside the mouth Thoughts of suicide or self-harm, worsening mood, or feelings of depression Unusual bruising or bleeding Side effects that usually do not require medical attention (report to your care team if they continue or are bothersome): Diarrhea Headache Nausea Stomach pain Vomiting This list may not describe all possible side effects. Call your doctor for medical advice about side effects. You may report side effects to FDA at 1-800-FDA-1088. Where should I keep my medication? Keep out of the reach of children and pets. Store at room temperature up to 30 degrees C (86 degrees F). Protect from light. Get rid of any unused medication after the expiration date. To get rid of medications that are no longer needed or have expired: Take the medication to a medication take-back  program. Check with your pharmacy or law enforcement to find a location. If you cannot return the medication, check the label or package insert to see if the medication should be thrown out in the garbage or flushed down the toilet. If you are not sure, ask your care team. If it is safe to put it in the trash, empty the medication out of the container. Mix the medication with cat litter, dirt, coffee grounds, or other unwanted substance. Seal the mixture in a bag or container. Put it in the trash. NOTE: This sheet is a summary. It may not cover all possible information. If you have questions about this medicine, talk to your doctor, pharmacist, or health care provider.  2022 Elsevier/Gold Standard (2021-04-18 00:00:00)  Standing Labs We placed an order today for your standing lab work.   Please have your standing labs drawn in 1 month, 3 months and then every 5 months  If possible, please have your labs drawn 2 weeks prior to your appointment so that the provider can discuss your results at your appointment.  Please note that you may see your imaging and lab results in MyChart before we have reviewed them. We may be awaiting multiple results to interpret others before contacting you. Please allow our office up to 72 hours to thoroughly review all of the results before contacting the office for clarification of your results.  We have open lab daily: Monday through Thursday from 1:30-4:30 PM and Friday from 1:30-4:00 PM at the office of   Dr. Hasten Sweitzer, [facility].   Please be advised, all patients with office appointments requiring lab work will take precedent over walk-in lab work.  If possible, please come for your lab work on Monday and Friday afternoons, as you may experience shorter wait times. The office is located at [address], [address], [place], [place] [address] No appointment is necessary.   Labs are drawn by [facility]. Please bring your co-pay at the time of  your lab draw.  You may receive a bill from [facility] for your lab work.  If you wish to have your labs drawn at another location, please call the office 24 hours in advance to send orders.  If you have any questions regarding directions or hours of operation,  please call [phone].   As a reminder, please drink plenty of water prior to coming for your lab work. Thanks!   Vaccines You are taking a medication(s) that can suppress your immune system.  The following immunizations are recommended: Flu annually Covid-19  Td/Tdap (tetanus, diphtheria, pertussis) every 10 years Pneumonia (Prevnar 15 then Pneumovax 23 at least 1 year apart.  Alternatively, can take Prevnar 20 without needing additional dose) Shingrix: 2 doses from 4 weeks to 6 months apart  Please check with your PCP to make sure you are up to date.   

## 2022-01-24 NOTE — Progress Notes (Deleted)
Office Visit Note  Patient: Amanda Kemp             Date of Birth: [birth year]           MRN: [id number]             PCP: Berkley Harvey, NP Referring: Berkley Harvey, NP Visit Date: 02/05/2022 Occupation: @GUAROCC @  Subjective:  No chief complaint on file.   History of Present Illness: Amanda Kemp is a 71 y.o. female ***   Activities of Daily Living:  Patient reports morning stiffness for *** {minute/hour:19697}.   Patient {ACTIONS;DENIES/REPORTS:21021675::"Denies"} nocturnal pain.  Difficulty dressing/grooming: {ACTIONS;DENIES/REPORTS:21021675::"Denies"} Difficulty climbing stairs: {ACTIONS;DENIES/REPORTS:21021675::"Denies"} Difficulty getting out of chair: {ACTIONS;DENIES/REPORTS:21021675::"Denies"} Difficulty using hands for taps, buttons, cutlery, and/or writing: {ACTIONS;DENIES/REPORTS:21021675::"Denies"}  No Rheumatology ROS completed.   PMFS History:  Patient Active Problem List   Diagnosis Date Noted   Primary osteoarthritis of both knees 12/13/2021   TB lung, latent 12/12/2021   Elevated LDL cholesterol level 11/28/2021   Prediabetes 11/28/2021   History of thyroid disease 11/28/2021   Essential hypertension 11/28/2021   Environmental and seasonal allergies 05/08/2015    Past Medical History:  Diagnosis Date   Allergy    Hypertension    Thyroid disease     Family History  Problem Relation Age of Onset   Hyperlipidemia Mother    Hypertension Mother    Cancer Mother    Cancer Sister        kidney cancer   Cancer Sister        lung cancer   Cancer Brother        prostate cancer   Healthy Son    Healthy Daughter    Healthy Daughter    Past Surgical History:  Procedure Laterality Date   ABDOMINAL HYSTERECTOMY     Social History   Social History Narrative   Divorced. Education: [employer]. Exercise: Some.   Immunization History  Administered Date(s) Administered   Influenza, High Dose Seasonal PF 11/22/2018    Influenza,inj,Quad PF,6+ Mos 08/15/2017   Influenza-Unspecified 09/15/2016   PFIZER Comirnaty(Gray Top)Covid-19 Tri-Sucrose Vaccine 04/16/2020, 05/07/2020   PFIZER(Purple Top)SARS-COV-2 Vaccination 04/16/2020, 05/07/2020   Pneumococcal Conjugate-13 11/16/2017   Tdap 05/21/2015     Objective: Vital Signs: There were no vitals taken for this visit.   Physical Exam   Musculoskeletal Exam: ***  CDAI Exam: CDAI Score: -- Patient Global: --; Provider Global: -- Swollen: --; Tender: -- Joint Exam 02/05/2022   No joint exam has been documented for this visit   There is currently no information documented on the homunculus. Go to the Rheumatology activity and complete the homunculus joint exam.  Investigation: No additional findings.  Imaging: No results found.  Recent Labs: Lab Results  Component Value Date   WBC 5.9 02/03/2018   HGB 12.7 02/03/2018   PLT 287 10/28/2017   NA 143 02/03/2018   K 4.6 02/03/2018   CL 107 (H) 02/03/2018   CO2 22 02/03/2018   GLUCOSE 103 (H) 02/03/2018   BUN 15 02/03/2018   CREATININE 0.80 02/03/2018   BILITOT 0.3 12/12/2021   ALKPHOS 71 02/03/2018   AST 21 12/12/2021   ALT 21 12/12/2021   PROT 7.2 12/12/2021   ALBUMIN 4.2 02/03/2018   CALCIUM 9.4 02/03/2018   GFRAA 89 02/03/2018   QFTBGOLDPLUS POSITIVE (A) 11/28/2021    Speciality Comments: No specialty comments available.  Procedures:  No procedures performed Allergies: Patient has no known allergies.   Assessment / Plan:  Visit Diagnoses: No diagnosis found.  Orders: No orders of the defined types were placed in this encounter.  No orders of the defined types were placed in this encounter.   Face-to-face time spent with patient was *** minutes. Greater than 50% of time was spent in counseling and coordination of care.  Follow-Up Instructions: No follow-ups on file.   Earnestine Mealing, CMA  Note - This record has been created using Editor, commissioning.  Chart creation  errors have been sought, but may not always  have been located. Such creation errors do not reflect on  the standard of medical care.

## 2022-02-05 ENCOUNTER — Ambulatory Visit: Payer: Medicare (Managed Care) | Admitting: Physician Assistant

## 2022-02-05 DIAGNOSIS — M19011 Primary osteoarthritis, right shoulder: Secondary | ICD-10-CM

## 2022-02-05 DIAGNOSIS — Z9181 History of falling: Secondary | ICD-10-CM

## 2022-02-05 DIAGNOSIS — Z8639 Personal history of other endocrine, nutritional and metabolic disease: Secondary | ICD-10-CM

## 2022-02-05 DIAGNOSIS — I10 Essential (primary) hypertension: Secondary | ICD-10-CM

## 2022-02-05 DIAGNOSIS — M791 Myalgia, unspecified site: Secondary | ICD-10-CM

## 2022-02-05 DIAGNOSIS — Z79899 Other long term (current) drug therapy: Secondary | ICD-10-CM

## 2022-02-05 DIAGNOSIS — R7303 Prediabetes: Secondary | ICD-10-CM

## 2022-02-05 DIAGNOSIS — M5136 Other intervertebral disc degeneration, lumbar region: Secondary | ICD-10-CM

## 2022-02-05 DIAGNOSIS — M7502 Adhesive capsulitis of left shoulder: Secondary | ICD-10-CM

## 2022-02-05 DIAGNOSIS — R7612 Nonspecific reaction to cell mediated immunity measurement of gamma interferon antigen response without active tuberculosis: Secondary | ICD-10-CM

## 2022-02-05 DIAGNOSIS — E7800 Pure hypercholesterolemia, unspecified: Secondary | ICD-10-CM

## 2022-02-05 DIAGNOSIS — M170 Bilateral primary osteoarthritis of knee: Secondary | ICD-10-CM

## 2022-02-05 DIAGNOSIS — M1990 Unspecified osteoarthritis, unspecified site: Secondary | ICD-10-CM

## 2022-02-06 DIAGNOSIS — I10 Essential (primary) hypertension: Secondary | ICD-10-CM | POA: Diagnosis not present

## 2022-02-06 DIAGNOSIS — R7303 Prediabetes: Secondary | ICD-10-CM | POA: Diagnosis not present

## 2022-02-06 DIAGNOSIS — M25511 Pain in right shoulder: Secondary | ICD-10-CM | POA: Diagnosis not present

## 2022-02-06 DIAGNOSIS — M25512 Pain in left shoulder: Secondary | ICD-10-CM | POA: Diagnosis not present

## 2022-02-06 DIAGNOSIS — M170 Bilateral primary osteoarthritis of knee: Secondary | ICD-10-CM | POA: Diagnosis not present

## 2022-02-06 DIAGNOSIS — G8929 Other chronic pain: Secondary | ICD-10-CM | POA: Diagnosis not present

## 2022-02-11 DIAGNOSIS — I10 Essential (primary) hypertension: Secondary | ICD-10-CM | POA: Diagnosis not present

## 2022-02-11 DIAGNOSIS — G8929 Other chronic pain: Secondary | ICD-10-CM | POA: Diagnosis not present

## 2022-02-11 DIAGNOSIS — R7303 Prediabetes: Secondary | ICD-10-CM | POA: Diagnosis not present

## 2022-02-11 DIAGNOSIS — M25562 Pain in left knee: Secondary | ICD-10-CM | POA: Diagnosis not present

## 2022-02-24 DIAGNOSIS — E7800 Pure hypercholesterolemia, unspecified: Secondary | ICD-10-CM | POA: Diagnosis not present

## 2022-03-24 ENCOUNTER — Other Ambulatory Visit: Payer: Self-pay | Admitting: *Deleted

## 2022-03-25 ENCOUNTER — Other Ambulatory Visit: Payer: Self-pay | Admitting: *Deleted

## 2022-05-08 DIAGNOSIS — M1712 Unilateral primary osteoarthritis, left knee: Secondary | ICD-10-CM | POA: Diagnosis not present

## 2022-05-08 DIAGNOSIS — G8929 Other chronic pain: Secondary | ICD-10-CM | POA: Diagnosis not present

## 2022-05-08 DIAGNOSIS — M25562 Pain in left knee: Secondary | ICD-10-CM | POA: Diagnosis not present

## 2022-06-26 DIAGNOSIS — H25813 Combined forms of age-related cataract, bilateral: Secondary | ICD-10-CM | POA: Diagnosis not present

## 2022-06-26 DIAGNOSIS — H5203 Hypermetropia, bilateral: Secondary | ICD-10-CM | POA: Diagnosis not present

## 2022-07-31 DIAGNOSIS — R7303 Prediabetes: Secondary | ICD-10-CM | POA: Diagnosis not present

## 2022-07-31 DIAGNOSIS — M1712 Unilateral primary osteoarthritis, left knee: Secondary | ICD-10-CM | POA: Diagnosis not present

## 2022-07-31 DIAGNOSIS — I10 Essential (primary) hypertension: Secondary | ICD-10-CM | POA: Diagnosis not present

## 2022-07-31 DIAGNOSIS — J309 Allergic rhinitis, unspecified: Secondary | ICD-10-CM | POA: Diagnosis not present

## 2022-07-31 DIAGNOSIS — Z20822 Contact with and (suspected) exposure to covid-19: Secondary | ICD-10-CM | POA: Diagnosis not present

## 2022-07-31 DIAGNOSIS — R051 Acute cough: Secondary | ICD-10-CM | POA: Diagnosis not present

## 2022-09-19 DIAGNOSIS — Z23 Encounter for immunization: Secondary | ICD-10-CM | POA: Diagnosis not present

## 2022-11-04 DIAGNOSIS — Z1231 Encounter for screening mammogram for malignant neoplasm of breast: Secondary | ICD-10-CM | POA: Diagnosis not present

## 2022-12-01 DIAGNOSIS — Z0000 Encounter for general adult medical examination without abnormal findings: Secondary | ICD-10-CM | POA: Diagnosis not present

## 2022-12-01 DIAGNOSIS — M25562 Pain in left knee: Secondary | ICD-10-CM | POA: Diagnosis not present

## 2022-12-01 DIAGNOSIS — E7800 Pure hypercholesterolemia, unspecified: Secondary | ICD-10-CM | POA: Diagnosis not present

## 2022-12-01 DIAGNOSIS — G8929 Other chronic pain: Secondary | ICD-10-CM | POA: Diagnosis not present

## 2022-12-01 DIAGNOSIS — R7303 Prediabetes: Secondary | ICD-10-CM | POA: Diagnosis not present

## 2022-12-01 DIAGNOSIS — Z8639 Personal history of other endocrine, nutritional and metabolic disease: Secondary | ICD-10-CM | POA: Diagnosis not present

## 2022-12-01 DIAGNOSIS — Z79899 Other long term (current) drug therapy: Secondary | ICD-10-CM | POA: Diagnosis not present

## 2022-12-01 DIAGNOSIS — R140 Abdominal distension (gaseous): Secondary | ICD-10-CM | POA: Diagnosis not present

## 2022-12-01 DIAGNOSIS — B351 Tinea unguium: Secondary | ICD-10-CM | POA: Diagnosis not present

## 2022-12-01 DIAGNOSIS — K5900 Constipation, unspecified: Secondary | ICD-10-CM | POA: Diagnosis not present

## 2022-12-25 ENCOUNTER — Emergency Department (HOSPITAL_COMMUNITY)
Admission: EM | Admit: 2022-12-25 | Discharge: 2022-12-25 | Disposition: A | Discharge status: Home / Self Care | Payer: Medicare HMO | Attending: Emergency Medicine | Admitting: Emergency Medicine

## 2022-12-25 ENCOUNTER — Emergency Department (HOSPITAL_COMMUNITY): Payer: Medicare HMO

## 2022-12-25 ENCOUNTER — Encounter (HOSPITAL_COMMUNITY): Payer: Self-pay

## 2022-12-25 ENCOUNTER — Other Ambulatory Visit: Payer: Self-pay

## 2022-12-25 DIAGNOSIS — M5440 Lumbago with sciatica, unspecified side: Secondary | ICD-10-CM

## 2022-12-25 DIAGNOSIS — M545 Low back pain, unspecified: Secondary | ICD-10-CM | POA: Insufficient documentation

## 2022-12-25 MED ORDER — PREDNISONE 20 MG PO TABS
60.0000 mg | ORAL_TABLET | Freq: Once | ORAL | Status: AC
  Administered 2022-12-25: 60 mg via ORAL
  Filled 2022-12-25: qty 3

## 2022-12-25 MED ORDER — OXYCODONE-ACETAMINOPHEN 5-325 MG PO TABS
1.0000 | ORAL_TABLET | Freq: Once | ORAL | Status: AC
  Administered 2022-12-25: 1 via ORAL
  Filled 2022-12-25: qty 1

## 2022-12-25 MED ORDER — TRAMADOL HCL 50 MG PO TABS: 50.0000 mg | ORAL_TABLET | Freq: Four times a day (QID) | ORAL | 0 refills | Status: AC | PRN

## 2022-12-25 MED ORDER — METHOCARBAMOL 500 MG PO TABS
750.0000 mg | ORAL_TABLET | Freq: Once | ORAL | Status: AC
  Administered 2022-12-25: 750 mg via ORAL
  Filled 2022-12-25: qty 2

## 2022-12-25 MED ORDER — PREDNISONE 20 MG PO TABS: ORAL_TABLET | ORAL | 0 refills | Status: AC

## 2022-12-25 NOTE — ED Provider Triage Note (Signed)
Emergency Medicine Provider Triage Evaluation Note  Amanda Kemp , a 72 y.o. female  was evaluated in triage.  Pt complains of lower back pain, left worse than right.  Denies trauma.  Symptoms began 2 days ago and have progressively gotten worse.  States she has to walk hunched over due to the pain.  Any movement makes the pain worse.  Denies extremity numbness, saddle paresthesias, fevers, chills, urinary or fecal incontinence.  Pain does not radiate into the lower extremities.  Patient has no history of similar  Review of Systems  Positive: As above Negative: As above  Physical Exam  BP 129/73 (BP Location: Right Arm)   Pulse 94   Temp 99.6 F (37.6 C)   Resp 16   Ht 5' 5.5" (1.664 m)   Wt 83 kg   SpO2 97%   BMI 29.99 kg/m  Gen:   Awake, no distress   Resp:  Normal effort  MSK:   Moves extremities without difficulty  Other:    Medical Decision Making  Medically screening exam initiated at 5:25 PM.  Appropriate orders placed.  Amanda Kemp was informed that the remainder of the evaluation will be completed by another provider, this initial triage assessment does not replace that evaluation, and the importance of remaining in the ED until their evaluation is complete.  Patient requested x-ray.  This will be ordered   Roylene Reason, Hershal Coria 12/25/22 1727

## 2022-12-25 NOTE — ED Provider Notes (Signed)
[facility] EMERGENCY DEPARTMENT Provider Note   CSN: [id number] Arrival date & time: 12/25/22  1632     History  Chief Complaint  Patient presents with   Back Pain    Amanda Kemp is a 72 y.o. female.  Pt with hx c/o right low back pain in the past 2-3 days. Symptoms acute onset, persistent, mod-severe, radiating to right buttock and leg. No saddle area or leg numbness. No weakness or problems walking. No urinary retention or urine/stool incontinence. No anterior pain, no abd or pelvic pain. No dysuria or hematuria. No hx kidney stones. No fever or chills. Hx intermittent back pain in past, no prior surgery. No rash/skin lesions in area of pain.   The history is provided by the patient, medical records and the spouse.  Back Pain Associated symptoms: no abdominal pain, no chest pain, no dysuria, no fever, no headaches, no numbness and no weakness        Home Medications Prior to Admission medications   Medication Sig Start Date End Date Taking? Authorizing Provider  amLODipine (NORVASC) 5 MG tablet Take 5 mg by mouth daily. 10/29/21   [provider]  cyclobenzaprine (FLEXERIL) 5 MG tablet Take 1 tablet (5 mg total) by mouth 3 (three) times daily as needed. 11/02/21   Charlynne Pander, MD  famotidine (PEPCID) 20 MG tablet Take 1 tablet (20 mg total) by mouth at bedtime as needed for heartburn or indigestion. Patient not taking: Reported on 12/25/2021 07/29/17   Ofilia Neas, PA-C  fluticasone (FLONASE SENSIMIST) 27.5 MCG/SPRAY nasal spray Place 2 sprays into the nose daily. Patient not taking: Reported on 11/28/2021 04/21/18   Ofilia Neas, PA-C  fluticasone [facility]) 50 MCG/ACT nasal spray Place 1 spray daily into both nostrils. 10/28/17   Petrucelli, Samantha R, PA-C  HYDROcodone-acetaminophen (NORCO/VICODIN) 5-325 MG tablet Take 1 tablet by mouth every 6 (six) hours as needed. 11/02/21   Charlynne Pander, MD  hydroxychloroquine  (PLAQUENIL) 200 MG tablet Take 1 tablet by mouth twice daily, Monday through Friday only. 12/25/21   Pollyann Savoy, MD  levothyroxine (SYNTHROID, LEVOTHROID) 100 MCG tablet Take 1 tablet (100 mcg total) by mouth daily. Please stop taking 88 mcg daily and take this instead. 04/21/18   Ofilia Neas, PA-C  predniSONE (DELTASONE) 5 MG tablet Take 10mg  po qd for 2 weeks, then take 7.5mg  po qd for 2 weeks, then take 5mg  po qd for 2 weeks. 11/28/21   , MD  rifampin (RIFADIN) 300 MG capsule Take 2 capsules (600 mg total) by mouth daily. 12/13/21   Pollyann Savoy, MD  rosuvastatin (CRESTOR) 10 MG tablet Take 1 tablet (10 mg total) by mouth daily. Patient not taking: Reported on 11/28/2021 04/21/18   11/30/2021, PA-C  rosuvastatin (CRESTOR) 20 MG tablet Take 1 tablet by mouth daily. 10/29/21   [provider]      Allergies    Patient has no known allergies.    Review of Systems   Review of Systems  Constitutional:  Negative for chills and fever.  HENT:  Negative for sore throat.   Eyes:  Negative for redness.  Respiratory:  Negative for cough and shortness of breath.   Cardiovascular:  Negative for chest pain and leg swelling.  Gastrointestinal:  Negative for abdominal pain, nausea and vomiting.  Genitourinary:  Negative for dysuria and hematuria.  Musculoskeletal:  Positive for back pain. Negative for neck pain.  Skin:  Negative for rash.  Neurological:  Negative for weakness, numbness and headaches.  Hematological:  Does not bruise/bleed easily.  Psychiatric/Behavioral:  Negative for confusion.     Physical Exam Updated Vital Signs BP 129/73 (BP Location: Right Arm)   Pulse 94   Temp 99.6 F (37.6 C)   Resp 16   Ht 1.664 m (5' 5.5")   Wt 83 kg   SpO2 97%   BMI 29.99 kg/m  Physical Exam Vitals and nursing note reviewed.  Constitutional:      Appearance: Normal appearance. She is well-developed.  HENT:     Head: Atraumatic.     Nose: Nose  normal.     Mouth/Throat:     Mouth: Mucous membranes are moist.  Eyes:     General: No scleral icterus.    Conjunctiva/sclera: Conjunctivae normal.  Neck:     Trachea: No tracheal deviation.  Cardiovascular:     Rate and Rhythm: Normal rate.     Pulses: Normal pulses.  Pulmonary:     Effort: Pulmonary effort is normal. No respiratory distress.  Abdominal:     General: There is no distension.     Palpations: Abdomen is soft. There is no mass.     Tenderness: There is no abdominal tenderness.  Genitourinary:    Comments: No cva tenderness.  Musculoskeletal:        General: No swelling.     Cervical back: Normal range of motion and neck supple. No rigidity or tenderness. No muscular tenderness.     Comments: CTLS spine, non tender, aligned, no step off. Tenderness at right sciatic notch and right lumbar muscles. Straight leg raise equivocal.   Skin:    General: Skin is warm and dry.     Findings: No rash.  Neurological:     Mental Status: She is alert.     Comments: Alert, speech normal. Motor intact bil lower ext, stre 5/5. Sens grossly intact. Straight leg raise equivocal.   Psychiatric:        Mood and Affect: Mood normal.     ED Results / Procedures / Treatments   Labs (all labs ordered are listed, but only abnormal results are displayed) Labs Reviewed - No data to display  EKG None  Radiology DG Lumbar Spine Complete  Result Date: 12/25/2022 CLINICAL DATA:  Back pain EXAM: LUMBAR SPINE - COMPLETE 4+ VIEW COMPARISON:  01/26/2017 moderate FINDINGS: Five lumbar type vertebral segments. Vertebral body heights and alignment are maintained. No fracture identified. Disc height loss the L3-4 level. The remaining intervertebral disc spaces are relatively preserved. Minimal degenerative endplate changes. Mild lower lumbar facet arthrosis. There is some cortical irregularity of the distal sacrum at the S5 level, although this does not appear appreciably changed since 2018.  Calcification projecting adjacent to the left L2 transverse process is unchanged from 2018. IMPRESSION: 1. Mild degenerative changes of the lumbar spine, most pronounced at L3-4. No acute fracture or traumatic listhesis of the lumbar spine. 2. Cortical irregularity of the distal sacrum, similar in appearance to 2018. Fracture at this location would be difficult to exclude. Correlate with point tenderness. Electronically Signed   By: Davina Poke D.O.   On: 12/25/2022 18:22    Procedures Procedures    Medications Ordered in ED Medications  predniSONE (DELTASONE) tablet 60 mg (has no administration in time range)  oxyCODONE-acetaminophen (PERCOCET/ROXICET) 5-325 MG per tablet 1 tablet (has no administration in time range)  methocarbamol (ROBAXIN) tablet 750 mg (has no administration in time range)  ED Course/ Medical Decision Making/ A&P                           Medical Decision Making Problems Addressed: Acute right-sided low back pain with sciatica, sciatica laterality unspecified: acute illness or injury with systemic symptoms that poses a threat to life or bodily functions  Amount and/or Complexity of Data Reviewed Independent Historian: spouse    Details: hx Radiology: ordered and independent interpretation performed. Decision-making details documented in ED Course.  Risk Prescription drug management.   Imaging ordered.   Reviewed nursing notes and prior charts for additional history.   Xrays reviewed/interpreted by me  - no fx. +degen changes. Prednisone po, percocet po, robaxin po.  Recheck pt more comfortable, no distress.  Rec pcp f/u. Rx for home.  Return precautions provided.           Final Clinical Impression(s) / ED Diagnoses Final diagnoses:  None    Rx / DC Orders ED Discharge Orders     None         Lajean Saver, MD 12/25/22 2005

## 2022-12-25 NOTE — ED Triage Notes (Signed)
"  Right lower back pain started a few days ago, now I can't hardly lay down or sit up due to the pain" per pt Denies any injury, Denies dysuria

## 2022-12-25 NOTE — Discharge Instructions (Addendum)
It was our pleasure to provide your ER care today - we hope that you feel better.  Avoid heavy lifting > 10 lbs for the next 3-4 days, and avoid bending over at waist.  Try heat therapy and/or gentle massage for symptom relief.   Take prednisone as prescribed. Take acetaminophen as need for pain. You may also take ultram as need for pain - no driving for the next 6 hours or if taking ultram.   Follow up closely with primary care doctor in the coming week.   Return to ER if worse, new symptoms, fevers, severe or intractable pain, numbness/weakness, trouble urinating, or other emergency concern.

## 2022-12-31 ENCOUNTER — Ambulatory Visit: Payer: Medicare HMO | Admitting: Podiatry

## 2022-12-31 ENCOUNTER — Other Ambulatory Visit: Payer: Self-pay | Admitting: Podiatry

## 2022-12-31 VITALS — BP 130/68

## 2022-12-31 DIAGNOSIS — Z79899 Other long term (current) drug therapy: Secondary | ICD-10-CM

## 2022-12-31 DIAGNOSIS — B351 Tinea unguium: Secondary | ICD-10-CM | POA: Diagnosis not present

## 2022-12-31 NOTE — Progress Notes (Signed)
Subjective:  Patient ID: Amanda Kemp, female    DOB: 1951-05-31,  MRN: [id number]  Chief Complaint  Patient presents with   Nail Problem    Bilateral hallux discoloration     72 y.o. female presents with the above complaint.  Patient presents with bilateral thickened elongated dystrophic mycotic toenails x 2 mild pain on palpation hurts with ambulation is causing her some discomfort when she ambulates.  She wanted discuss treatment options for nail fungus she has not seen anyone else prior to seeing me.  Pain scale is 2 out of 10 dull achy in nature.   Review of Systems: Negative except as noted in the HPI. Denies N/V/F/Ch.  Past Medical History:  Diagnosis Date   Allergy    Hypertension    Thyroid disease     Current Outpatient Medications:    amLODipine (NORVASC) 5 MG tablet, Take 5 mg by mouth daily., Disp: , Rfl:    cyclobenzaprine (FLEXERIL) 5 MG tablet, Take 1 tablet (5 mg total) by mouth 3 (three) times daily as needed., Disp: 10 tablet, Rfl: 0   famotidine (PEPCID) 20 MG tablet, Take 1 tablet (20 mg total) by mouth at bedtime as needed for heartburn or indigestion. (Patient not taking: Reported on 12/25/2021), Disp: 30 tablet, Rfl: 1   fluticasone (FLONASE SENSIMIST) 27.5 MCG/SPRAY nasal spray, Place 2 sprays into the nose daily. (Patient not taking: Reported on 11/28/2021), Disp: 10 g, Rfl: 12   fluticasone (FLONASE) 50 MCG/ACT nasal spray, Place 1 spray daily into both nostrils., Disp: 16 g, Rfl: 2   HYDROcodone-acetaminophen (NORCO/VICODIN) 5-325 MG tablet, Take 1 tablet by mouth every 6 (six) hours as needed., Disp: 10 tablet, Rfl: 0   hydroxychloroquine (PLAQUENIL) 200 MG tablet, Take 1 tablet by mouth twice daily, Monday through Friday only., Disp: 40 tablet, Rfl: 2   levothyroxine (SYNTHROID, LEVOTHROID) 100 MCG tablet, Take 1 tablet (100 mcg total) by mouth daily. Please stop taking 88 mcg daily and take this instead., Disp: 180 tablet, Rfl: 1   predniSONE  (DELTASONE) 20 MG tablet, 3 po once a day for 2 days, then 2 po once a day for 3 days, then 1 po once a day for 3 days, Disp: 15 tablet, Rfl: 0   predniSONE (DELTASONE) 5 MG tablet, Take 10mg  po qd for 2 weeks, then take 7.5mg  po qd for 2 weeks, then take 5mg  po qd for 2 weeks., Disp: 63 tablet, Rfl: 0   rifampin (RIFADIN) 300 MG capsule, Take 2 capsules (600 mg total) by mouth daily., Disp: 60 capsule, Rfl: 3   rosuvastatin (CRESTOR) 10 MG tablet, Take 1 tablet (10 mg total) by mouth daily. (Patient not taking: Reported on 11/28/2021), Disp: 90 tablet, Rfl: 3   rosuvastatin (CRESTOR) 20 MG tablet, Take 1 tablet by mouth daily., Disp: , Rfl:    traMADol (ULTRAM) 50 MG tablet, Take 1 tablet (50 mg total) by mouth every 6 (six) hours as needed., Disp: 20 tablet, Rfl: 0  Social History   Tobacco Use  Smoking Status Never  Smokeless Tobacco Never    No Known Allergies Objective:   Vitals:   12/31/22 0819  BP: 130/68   There is no height or weight on file to calculate BMI. Constitutional Well developed. Well nourished.  Vascular Dorsalis pedis pulses palpable bilaterally. Posterior tibial pulses palpable bilaterally. Capillary refill normal to all digits.  No cyanosis or clubbing noted. Pedal hair growth normal.  Neurologic Normal speech. Oriented to person, place, and time. Epicritic  sensation to light touch grossly present bilaterally.  Dermatologic Nails thickened elongated dystrophic mycotic toenails x 2 mild pain on palpation Skin within normal limits  Orthopedic: Normal joint ROM without pain or crepitus bilaterally. No visible deformities. No bony tenderness.   Radiographs: None Assessment:   1. Long-term use of high-risk medication   2. Nail fungus   3. Onychomycosis due to dermatophyte    Plan:  Patient was evaluated and treated and all questions answered.  Bilateral hallux onychomycosis -Educated the patient on the etiology of onychomycosis and various treatment  options associated with improving the fungal load.  I explained to the patient that there is 3 treatment options available to treat the onychomycosis including topical, p.o., laser treatment.  Patient elected to undergo p.o. options with Lamisil/terbinafine therapy.  In order for me to start the medication therapy, I explained to the patient the importance of evaluating the liver and obtaining the liver function test.  Once the liver function test comes back normal I will start him on 66-month course of Lamisil therapy.  Patient understood all risk and would like to proceed with Lamisil therapy.  I have asked the patient to immediately stop the Lamisil therapy if she has any reactions to it and call the office or go to the emergency room right away.  Patient states understanding   No follow-ups on file.

## 2023-01-01 LAB — HEPATIC FUNCTION PANEL
ALT: 14 IU/L (ref 0–32)
AST: 20 IU/L (ref 0–40)
Albumin: 4.3 g/dL (ref 3.9–4.9)
Alkaline Phosphatase: 65 IU/L (ref 44–121)
Bilirubin Total: 0.3 mg/dL (ref 0.0–1.2)
Bilirubin, Direct: <0.1 mg/dL (ref 0.00–0.40)
Total Protein: 7.3 g/dL (ref 6.0–8.5)

## 2023-01-01 MED ORDER — TERBINAFINE HCL 250 MG PO TABS: 250.0000 mg | ORAL_TABLET | Freq: Every day | ORAL | 0 refills | Status: AC

## 2023-01-01 NOTE — Addendum Note (Signed)
Addended by: Kenzi Bardwell on: 01/01/2023 11:26 AM   Modules accepted: Orders  

## 2023-05-01 ENCOUNTER — Ambulatory Visit (INDEPENDENT_AMBULATORY_CARE_PROVIDER_SITE_OTHER): Payer: Medicare HMO | Admitting: Podiatry

## 2023-05-01 DIAGNOSIS — L600 Ingrowing nail: Secondary | ICD-10-CM

## 2023-05-01 NOTE — Progress Notes (Signed)
Subjective:  Patient ID: Amanda Kemp, female    DOB: 1950-12-22,  MRN: [id number]  Chief Complaint  Patient presents with   Nail Problem    Nail fungus     72 y.o. female presents with the above complaint.  Patient presents with bilateral thickened elongated dystrophic mycotic toenails x 2 to the hallux.  Patient states is painful to touch painful to walk on pain scale 7 out of 10 she is tried treating for nail fungus has not helped.  She would like to have them both removed made permanent.   Review of Systems: Negative except as noted in the HPI. Denies N/V/F/Ch.  Past Medical History:  Diagnosis Date   Allergy    Hypertension    Thyroid disease     Current Outpatient Medications:    amLODipine (NORVASC) 5 MG tablet, Take 5 mg by mouth daily., Disp: , Rfl:    cyclobenzaprine (FLEXERIL) 5 MG tablet, Take 1 tablet (5 mg total) by mouth 3 (three) times daily as needed., Disp: 10 tablet, Rfl: 0   famotidine (PEPCID) 20 MG tablet, Take 1 tablet (20 mg total) by mouth at bedtime as needed for heartburn or indigestion. (Patient not taking: Reported on 12/25/2021), Disp: 30 tablet, Rfl: 1   fluticasone (FLONASE SENSIMIST) 27.5 MCG/SPRAY nasal spray, Place 2 sprays into the nose daily. (Patient not taking: Reported on 11/28/2021), Disp: 10 g, Rfl: 12   fluticasone (FLONASE) 50 MCG/ACT nasal spray, Place 1 spray daily into both nostrils., Disp: 16 g, Rfl: 2   HYDROcodone-acetaminophen (NORCO/VICODIN) 5-325 MG tablet, Take 1 tablet by mouth every 6 (six) hours as needed., Disp: 10 tablet, Rfl: 0   hydroxychloroquine (PLAQUENIL) 200 MG tablet, Take 1 tablet by mouth twice daily, Monday through Friday only., Disp: 40 tablet, Rfl: 2   levothyroxine (SYNTHROID, LEVOTHROID) 100 MCG tablet, Take 1 tablet (100 mcg total) by mouth daily. Please stop taking 88 mcg daily and take this instead., Disp: 180 tablet, Rfl: 1   predniSONE (DELTASONE) 20 MG tablet, 3 po once a day for 2 days, then 2 po  once a day for 3 days, then 1 po once a day for 3 days, Disp: 15 tablet, Rfl: 0   predniSONE (DELTASONE) 5 MG tablet, Take 10mg  po qd for 2 weeks, then take 7.5mg  po qd for 2 weeks, then take 5mg  po qd for 2 weeks., Disp: 63 tablet, Rfl: 0   rifampin (RIFADIN) 300 MG capsule, Take 2 capsules (600 mg total) by mouth daily., Disp: 60 capsule, Rfl: 3   rosuvastatin (CRESTOR) 10 MG tablet, Take 1 tablet (10 mg total) by mouth daily. (Patient not taking: Reported on 11/28/2021), Disp: 90 tablet, Rfl: 3   rosuvastatin (CRESTOR) 20 MG tablet, Take 1 tablet by mouth daily., Disp: , Rfl:    terbinafine (LAMISIL) 250 MG tablet, Take 1 tablet (250 mg total) by mouth daily., Disp: 90 tablet, Rfl: 0   traMADol (ULTRAM) 50 MG tablet, Take 1 tablet (50 mg total) by mouth every 6 (six) hours as needed., Disp: 20 tablet, Rfl: 0  Social History   Tobacco Use  Smoking Status Never  Smokeless Tobacco Never    No Known Allergies Objective:  There were no vitals filed for this visit. There is no height or weight on file to calculate BMI. Constitutional Well developed. Well nourished.  Vascular Dorsalis pedis pulses palpable bilaterally. Posterior tibial pulses palpable bilaterally. Capillary refill normal to all digits.  No cyanosis or clubbing noted. Pedal hair growth  normal.  Neurologic Normal speech. Oriented to person, place, and time. Epicritic sensation to light touch grossly present bilaterally.  Dermatologic Pain on palpation of the entire/total nail on 1st digit of the bilaterally No other open wounds. No skin lesions.  Orthopedic: Normal joint ROM without pain or crepitus bilaterally. No visible deformities. No bony tenderness.   Radiographs: None Assessment:   1. Ingrown toenail of right foot   2. Ingrown left big toenail    Plan:  Patient was evaluated and treated and all questions answered.  Nail contusion/dystrophy secondary to ingrown bilateral hallux, bilaterally -Patient  elects to proceed with minor surgery to remove entire toenail today. Consent reviewed and signed by patient. -Entire/total nail excised. See procedure note. -Educated on post-procedure care including soaking. Written instructions provided and reviewed. -Patient to follow up in 2 weeks for nail check.  Procedure: Excision of entire/total nail with phenol matricectomy Location: Bilateral 1st toe digit Anesthesia: Lidocaine 1% plain; 1.5 mL and Marcaine 0.5% plain; 1.5 mL, digital block. Skin Prep: Betadine. Dressing: Silvadene; telfa; dry, sterile, compression dressing. Technique: Following skin prep, the toe was exsanguinated and a tourniquet was secured at the base of the toe.  Phenol was used in standard technique.  The affected nail border was freed and excised. The tourniquet was then removed and sterile dressing applied. Disposition: Patient tolerated procedure well. Patient to return in 2 weeks for follow-up.   No follow-ups on file.

## 2023-06-29 ENCOUNTER — Telehealth: Payer: Self-pay | Admitting: *Deleted

## 2023-06-29 NOTE — Telephone Encounter (Signed)
Spoke with Ms Amanda Kemp regarding the out come of the paper work for [facility] program for Ms. Amanda Kemp. They have not heard anything from [facility].  I call [facility] [phone]) per their office patient has not returned the paper work that was to completed by her and her doctor.  They did  receive the referral I faxed to them on [phone].  The referral request was closed out because paper work was not returned.  Ms. Amanda Kemp is to get in contact with the social worker [phone].)
